# Patient Record
Sex: Female | Born: 1955 | Race: White | Hispanic: No | Marital: Married | Smoking: Never smoker
Health system: Southern US, Community
[De-identification: ages and names within clinical notes are randomized; demographics above are authoritative.]

## PROBLEM LIST (undated history)

## (undated) DIAGNOSIS — C50919 Malignant neoplasm of unspecified site of unspecified female breast: Secondary | ICD-10-CM

## (undated) DIAGNOSIS — D649 Anemia, unspecified: Secondary | ICD-10-CM

## (undated) DIAGNOSIS — T4145XA Adverse effect of unspecified anesthetic, initial encounter: Secondary | ICD-10-CM

## (undated) DIAGNOSIS — R112 Nausea with vomiting, unspecified: Secondary | ICD-10-CM

## (undated) DIAGNOSIS — M1711 Unilateral primary osteoarthritis, right knee: Secondary | ICD-10-CM

## (undated) DIAGNOSIS — F419 Anxiety disorder, unspecified: Secondary | ICD-10-CM

## (undated) DIAGNOSIS — T8859XA Other complications of anesthesia, initial encounter: Secondary | ICD-10-CM

## (undated) DIAGNOSIS — F32A Depression, unspecified: Secondary | ICD-10-CM

## (undated) DIAGNOSIS — Z9889 Other specified postprocedural states: Secondary | ICD-10-CM

## (undated) DIAGNOSIS — R51 Headache: Secondary | ICD-10-CM

## (undated) DIAGNOSIS — R519 Headache, unspecified: Secondary | ICD-10-CM

## (undated) DIAGNOSIS — Z87442 Personal history of urinary calculi: Secondary | ICD-10-CM

## (undated) DIAGNOSIS — F329 Major depressive disorder, single episode, unspecified: Secondary | ICD-10-CM

## (undated) DIAGNOSIS — Z9221 Personal history of antineoplastic chemotherapy: Secondary | ICD-10-CM

## (undated) HISTORY — PX: AXILLARY LYMPH NODE DISSECTION: SHX5229

## (undated) HISTORY — PX: DILATION AND CURETTAGE OF UTERUS: SHX78

---
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
## 1991-01-18 HISTORY — PX: ECTOPIC PREGNANCY SURGERY: SHX613

## 1999-01-18 HISTORY — PX: BREAST LUMPECTOMY: SHX2

## 1999-01-18 HISTORY — PX: MASTECTOMY: SHX3

## 2010-01-17 HISTORY — PX: BREAST RECONSTRUCTION: SHX9

## 2012-01-18 HISTORY — PX: KIDNEY STONE SURGERY: SHX686

## 2013-01-17 HISTORY — PX: KIDNEY STONE SURGERY: SHX686

## 2013-01-17 HISTORY — PX: KNEE ARTHROSCOPY W/ MENISCECTOMY: SHX1879

## 2014-01-17 HISTORY — PX: HERNIA REPAIR: SHX51

## 2014-12-17 ENCOUNTER — Encounter: Payer: Self-pay | Admitting: Physician Assistant

## 2014-12-17 DIAGNOSIS — F419 Anxiety disorder, unspecified: Secondary | ICD-10-CM | POA: Diagnosis present

## 2014-12-17 DIAGNOSIS — M1711 Unilateral primary osteoarthritis, right knee: Secondary | ICD-10-CM | POA: Diagnosis present

## 2014-12-17 NOTE — H&P (Signed)
TOTAL KNEE ADMISSION H&P  Patient is being admitted for right total knee arthroplasty.  Subjective:  Chief Complaint:right knee pain.  HPI: Dana Blanchard, 59 y.o. female, has a history of pain and functional disability in the right knee due to arthritis and has failed non-surgical conservative treatments for greater than 12 weeks to includeNSAID's and/or analgesics, corticosteriod injections, viscosupplementation injections, flexibility and strengthening excercises, supervised PT with diminished ADL's post treatment, use of assistive devices, weight reduction as appropriate and activity modification.  Onset of symptoms was gradual, starting 10 years ago with gradually worsening course since that time. The patient noted prior procedures on the knee to include  arthroscopy and menisectomy on the right knee(s).  Patient currently rates pain in the right knee(s) at 10 out of 10 with activity. Patient has night pain, worsening of pain with activity and weight bearing, pain that interferes with activities of daily living, crepitus and joint swelling.  Patient has evidence of subchondral sclerosis, periarticular osteophytes and joint space narrowing by imaging studies. There is no active infection.  Patient Active Problem List   Diagnosis Date Noted  . Primary localized osteoarthritis of right knee   . Anxiety    Past Medical History  Diagnosis Date  . Anxiety   . Primary localized osteoarthritis of right knee   . Breast cancer [facility])     Past Surgical History  Procedure Laterality Date  . Tonsillectomy  1975  . Ectopic pregnancy surgery  1993  . Cesarean section  1993  . Breast lumpectomy Left 2001  . Mastectomy Left 2001  . Breast reconstruction Left 2012  . Kidney stone surgery  2014  . Kidney stone surgery  2015  . Knee arthroscopy w/ meniscectomy Right 2015  . Hernia repair  2016    No prescriptions prior to admission   No Known Allergies  Social History  Substance Use Topics  .  Smoking status: Never Smoker   . Smokeless tobacco: Not on file  . Alcohol Use: No    Family History  Problem Relation Age of Onset  . Diabetes Mother   . Stroke Mother   . Breast cancer Mother   . Stroke Father   . Colon cancer Father      Review of Systems  Constitutional: Negative.   HENT: Negative.   Eyes: Negative.   Respiratory: Negative.   Cardiovascular: Negative.   Gastrointestinal: Negative.   Genitourinary: Negative.   Musculoskeletal: Positive for back pain and joint pain.  Skin: Negative.   Neurological: Negative.   Endo/Heme/Allergies: Negative.   Psychiatric/Behavioral: Negative.     Objective:  Physical Exam  Constitutional: She is oriented to person, place, and time. She appears well-developed and well-nourished.  HENT:  Head: Normocephalic and atraumatic.  Mouth/Throat: Oropharynx is clear and moist.  Eyes: Conjunctivae are normal. Pupils are equal, round, and reactive to light.  Neck: Neck supple.  Cardiovascular: Normal rate, regular rhythm and normal heart sounds.   Respiratory: Effort normal and breath sounds normal.  GI: Soft. Bowel sounds are normal.  Musculoskeletal:  Examination of her right knee reveals pain medial and laterally. Mild varus deformity. There is 1+ crepitation. There is 1+ synovitis.  Her  range of motion  is from -5 -125 degrees. The knee is stable. Examination of he left knee reveals a full range of motion without pain, swelling, weakness or instability.   Neurological: She is alert and oriented to person, place, and time.  Skin: Skin is warm and dry.  Psychiatric: She  has a normal mood and affect. Her behavior is normal.    Vital signs in last 24 hours: Temp:  [97.5 F (36.4 C)] 97.5 F (36.4 C) (11/30 1400) Pulse Rate:  [85] 85 (11/30 1400) BP: (134)/(82) 134/82 mmHg (11/30 1400) SpO2:  [97 %] 97 % (11/30 1400) Weight:  [95.255 kg (210 lb)] 95.255 kg (210 lb) (11/30 1400)  Labs:   Estimated body mass index is  37.21 kg/(m^2) as calculated from the following:   Height as of this encounter: 5\' 3"  (1.6 m).   Weight as of this encounter: 95.255 kg (210 lb).   Imaging Review Plain radiographs demonstrate severe degenerative joint disease of the right knee(s). The overall alignment issignificant varus. The bone quality appears to be good for age and reported activity level.  Assessment/Plan:  End stage arthritis, right knee  Active Problems:   Primary localized osteoarthritis of right knee   Anxiety breast cancer  The patient history, physical examination, clinical judgment of the provider and imaging studies are consistent with end stage degenerative joint disease of the right knee(s) and total knee arthroplasty is deemed medically necessary. The treatment options including medical management, injection therapy arthroscopy and arthroplasty were discussed at length. The risks and benefits of total knee arthroplasty were presented and reviewed. The risks due to aseptic loosening, infection, stiffness, patella tracking problems, thromboembolic complications and other imponderables were discussed. The patient acknowledged the explanation, agreed to proceed with the plan and consent was signed. Patient is being admitted for inpatient treatment for surgery, pain control, PT, OT, prophylactic antibiotics, VTE prophylaxis, progressive ambulation and ADL's and discharge planning. The patient is planning to be discharged home with home health services

## 2014-12-25 ENCOUNTER — Encounter (HOSPITAL_COMMUNITY): Payer: Self-pay

## 2014-12-25 ENCOUNTER — Encounter (HOSPITAL_COMMUNITY)
Admission: RE | Admit: 2014-12-25 | Discharge: 2014-12-25 | Disposition: A | Discharge status: Home / Self Care | Payer: BC Managed Care – PPO | Source: Ambulatory Visit | Attending: Orthopedic Surgery | Admitting: Orthopedic Surgery

## 2014-12-25 DIAGNOSIS — Z0183 Encounter for blood typing: Secondary | ICD-10-CM | POA: Insufficient documentation

## 2014-12-25 DIAGNOSIS — M1711 Unilateral primary osteoarthritis, right knee: Secondary | ICD-10-CM | POA: Diagnosis not present

## 2014-12-25 DIAGNOSIS — Z01812 Encounter for preprocedural laboratory examination: Secondary | ICD-10-CM | POA: Diagnosis present

## 2014-12-25 HISTORY — DX: Anemia, unspecified: D64.9

## 2014-12-25 HISTORY — DX: Adverse effect of unspecified anesthetic, initial encounter: T41.45XA

## 2014-12-25 HISTORY — DX: Major depressive disorder, single episode, unspecified: F32.9

## 2014-12-25 HISTORY — DX: Depression, unspecified: F32.A

## 2014-12-25 HISTORY — DX: Other complications of anesthesia, initial encounter: T88.59XA

## 2014-12-25 HISTORY — DX: Headache: R51

## 2014-12-25 HISTORY — DX: Headache, unspecified: R51.9

## 2014-12-25 HISTORY — DX: Personal history of urinary calculi: Z87.442

## 2014-12-25 HISTORY — DX: Nausea with vomiting, unspecified: R11.2

## 2014-12-25 HISTORY — DX: Other specified postprocedural states: Z98.890

## 2014-12-25 LAB — ABO/RH: ABO/RH(D): O POS

## 2014-12-25 LAB — CBC WITH DIFFERENTIAL/PLATELET
Basophils Absolute: 0 10*3/uL (ref 0.0–0.1)
Basophils Relative: 0 %
EOS PCT: 3 %
Eosinophils Absolute: 0.3 10*3/uL (ref 0.0–0.7)
HCT: 39.5 % (ref 36.0–46.0)
Hemoglobin: 13.2 g/dL (ref 12.0–15.0)
LYMPHS ABS: 3.3 10*3/uL (ref 0.7–4.0)
LYMPHS PCT: 39 %
MCH: 29.6 pg (ref 26.0–34.0)
MCHC: 33.4 g/dL (ref 30.0–36.0)
MCV: 88.6 fL (ref 78.0–100.0)
MONO ABS: 0.6 10*3/uL (ref 0.1–1.0)
MONOS PCT: 7 %
Neutro Abs: 4.2 10*3/uL (ref 1.7–7.7)
Neutrophils Relative %: 51 %
PLATELETS: 420 10*3/uL — AB (ref 150–400)
RBC: 4.46 MIL/uL (ref 3.87–5.11)
RDW: 13.1 % (ref 11.5–15.5)
WBC: 8.4 10*3/uL (ref 4.0–10.5)

## 2014-12-25 LAB — COMPREHENSIVE METABOLIC PANEL
ALT: 18 U/L (ref 14–54)
AST: 16 U/L (ref 15–41)
Albumin: 4.3 g/dL (ref 3.5–5.0)
Alkaline Phosphatase: 50 U/L (ref 38–126)
Anion gap: 8 (ref 5–15)
BUN: 18 mg/dL (ref 6–20)
CHLORIDE: 104 mmol/L (ref 101–111)
CO2: 25 mmol/L (ref 22–32)
CREATININE: 0.72 mg/dL (ref 0.44–1.00)
Calcium: 10.1 mg/dL (ref 8.9–10.3)
Glucose, Bld: 93 mg/dL (ref 65–99)
POTASSIUM: 4.5 mmol/L (ref 3.5–5.1)
Sodium: 137 mmol/L (ref 135–145)
Total Bilirubin: 0.5 mg/dL (ref 0.3–1.2)
Total Protein: 7.8 g/dL (ref 6.5–8.1)

## 2014-12-25 LAB — TYPE AND SCREEN
ABO/RH(D): O POS
Antibody Screen: NEGATIVE

## 2014-12-25 LAB — SURGICAL PCR SCREEN
MRSA, PCR: NEGATIVE
STAPHYLOCOCCUS AUREUS: NEGATIVE

## 2014-12-25 LAB — PROTIME-INR
INR: 1.13 (ref 0.00–1.49)
PROTHROMBIN TIME: 14.7 s (ref 11.6–15.2)

## 2014-12-25 LAB — APTT: aPTT: 31 seconds (ref 24–37)

## 2014-12-25 NOTE — Pre-Procedure Instructions (Signed)
    Dana Blanchard  12/25/2014      [id number] - [place],  - [address] [place] [address] Phone: [phone] Fax: [phone]    Your procedure is scheduled on 01/05/2015  Report to [facility] Admitting at 5:30  A.M.  Call this number if you have problems the morning of surgery:  [phone]   Remember:  Do not eat food or drink liquids after midnight. On Sunday night   Take these medicines the morning of surgery with A SIP OF WATER: Wellbutrin, Celexa               STOP all supplements & anti-inflammatories on 12/30/2014   Do not wear jewelry, make-up or nail polish.   Do not wear lotions, powders, or perfumes.  You may wear deodorant.   Do not shave 48 hours prior to surgery.    Do not bring valuables to the hospital.  [facility] is not responsible for any belongings or valuables.  Contacts, dentures or bridgework may not be worn into surgery.  Leave your suitcase in the car.  After surgery it may be brought to your room.  For patients admitted to the hospital, discharge time will be determined by your treatment team.  Patients discharged the day of surgery will not be allowed to drive home.   Name and phone number of your driver:   With spouse & daughter  Special instructions:  See SHOWER instructions     Please read over the following fact sheets that you were given. Pain Booklet, Coughing and Deep Breathing, Blood Transfusion Information, Total Joint Packet, MRSA Information and Surgical Site Infection Prevention

## 2014-12-26 LAB — URINE CULTURE

## 2015-01-04 MED ORDER — CHLORHEXIDINE GLUCONATE 4 % EX LIQD: 60.0000 mL | Freq: Once | CUTANEOUS | Status: DC

## 2015-01-04 MED ORDER — LACTATED RINGERS IV SOLN: INTRAVENOUS | Status: DC

## 2015-01-04 MED ORDER — CEFAZOLIN SODIUM-DEXTROSE 2-3 GM-% IV SOLR
2.0000 g | INTRAVENOUS | Status: AC
  Administered 2015-01-05: 2 g via INTRAVENOUS
  Filled 2015-01-04: qty 50

## 2015-01-05 ENCOUNTER — Inpatient Hospital Stay (HOSPITAL_COMMUNITY)
Admission: RE | Admit: 2015-01-05 | Discharge: 2015-01-07 | DRG: 470 | Disposition: A | Discharge status: Home Health | Payer: BC Managed Care – PPO | Source: Ambulatory Visit | Attending: Orthopedic Surgery | Admitting: Orthopedic Surgery

## 2015-01-05 ENCOUNTER — Encounter (HOSPITAL_COMMUNITY): Payer: Self-pay | Admitting: *Deleted

## 2015-01-05 ENCOUNTER — Encounter (HOSPITAL_COMMUNITY)
Admission: RE | Disposition: A | Discharge status: Home Health | Payer: Self-pay | Source: Ambulatory Visit | Attending: Orthopedic Surgery

## 2015-01-05 ENCOUNTER — Inpatient Hospital Stay (HOSPITAL_COMMUNITY): Payer: BC Managed Care – PPO | Admitting: Certified Registered"

## 2015-01-05 DIAGNOSIS — Z9889 Other specified postprocedural states: Secondary | ICD-10-CM

## 2015-01-05 DIAGNOSIS — R112 Nausea with vomiting, unspecified: Secondary | ICD-10-CM | POA: Diagnosis not present

## 2015-01-05 DIAGNOSIS — Z853 Personal history of malignant neoplasm of breast: Secondary | ICD-10-CM | POA: Diagnosis not present

## 2015-01-05 DIAGNOSIS — M1711 Unilateral primary osteoarthritis, right knee: Principal | ICD-10-CM | POA: Diagnosis present

## 2015-01-05 DIAGNOSIS — F419 Anxiety disorder, unspecified: Secondary | ICD-10-CM | POA: Diagnosis present

## 2015-01-05 DIAGNOSIS — M1710 Unilateral primary osteoarthritis, unspecified knee: Secondary | ICD-10-CM | POA: Diagnosis present

## 2015-01-05 DIAGNOSIS — M25561 Pain in right knee: Secondary | ICD-10-CM | POA: Diagnosis present

## 2015-01-05 DIAGNOSIS — M179 Osteoarthritis of knee, unspecified: Secondary | ICD-10-CM | POA: Diagnosis present

## 2015-01-05 HISTORY — DX: Unilateral primary osteoarthritis, right knee: M17.11

## 2015-01-05 HISTORY — DX: Other specified postprocedural states: Z98.890

## 2015-01-05 HISTORY — DX: Nausea with vomiting, unspecified: R11.2

## 2015-01-05 HISTORY — DX: Anxiety disorder, unspecified: F41.9

## 2015-01-05 HISTORY — PX: TOTAL KNEE ARTHROPLASTY: SHX125

## 2015-01-05 HISTORY — DX: Malignant neoplasm of unspecified site of unspecified female breast: C50.919

## 2015-01-05 LAB — CBC
HEMATOCRIT: 33.4 % — AB (ref 36.0–46.0)
Hemoglobin: 11.7 g/dL — ABNORMAL LOW (ref 12.0–15.0)
MCH: 30.3 pg (ref 26.0–34.0)
MCHC: 35 g/dL (ref 30.0–36.0)
MCV: 86.5 fL (ref 78.0–100.0)
PLATELETS: 358 10*3/uL (ref 150–400)
RBC: 3.86 MIL/uL — ABNORMAL LOW (ref 3.87–5.11)
RDW: 13 % (ref 11.5–15.5)
WBC: 7.9 10*3/uL (ref 4.0–10.5)

## 2015-01-05 LAB — CREATININE, SERUM
Creatinine, Ser: 0.68 mg/dL (ref 0.44–1.00)
GFR calc non Af Amer: >60 mL/min (ref >60)

## 2015-01-05 SURGERY — ARTHROPLASTY, KNEE, TOTAL
Anesthesia: Spinal | Site: Knee | Laterality: Right

## 2015-01-05 MED ORDER — BUPIVACAINE-EPINEPHRINE (PF) 0.25% -1:200000 IJ SOLN
INTRAMUSCULAR | Status: AC
  Filled 2015-01-05: qty 30

## 2015-01-05 MED ORDER — DEXAMETHASONE SODIUM PHOSPHATE 10 MG/ML IJ SOLN
INTRAMUSCULAR | Status: AC
  Filled 2015-01-05: qty 1

## 2015-01-05 MED ORDER — ONDANSETRON HCL 4 MG/2ML IJ SOLN
INTRAMUSCULAR | Status: AC
  Filled 2015-01-05: qty 4

## 2015-01-05 MED ORDER — ENOXAPARIN SODIUM 30 MG/0.3ML ~~LOC~~ SOLN
30.0000 mg | Freq: Two times a day (BID) | SUBCUTANEOUS | Status: DC
  Administered 2015-01-06 – 2015-01-07 (×3): 30 mg via SUBCUTANEOUS
  Filled 2015-01-05 (×3): qty 0.3

## 2015-01-05 MED ORDER — PHENOL 1.4 % MT LIQD
1.0000 | OROMUCOSAL | Status: DC | PRN
Start: 2015-01-05 — End: 2015-01-07

## 2015-01-05 MED ORDER — MENTHOL 3 MG MT LOZG: 1.0000 | LOZENGE | OROMUCOSAL | Status: DC | PRN

## 2015-01-05 MED ORDER — ONDANSETRON HCL 4 MG/2ML IJ SOLN: 4.0000 mg | Freq: Once | INTRAMUSCULAR | Status: DC | PRN

## 2015-01-05 MED ORDER — DEXAMETHASONE SODIUM PHOSPHATE 10 MG/ML IJ SOLN
10.0000 mg | Freq: Three times a day (TID) | INTRAMUSCULAR | Status: AC
  Administered 2015-01-05 – 2015-01-06 (×3): 10 mg via INTRAVENOUS
  Filled 2015-01-05 (×3): qty 1

## 2015-01-05 MED ORDER — MIDAZOLAM HCL 5 MG/5ML IJ SOLN
INTRAMUSCULAR | Status: DC | PRN
  Administered 2015-01-05: 2 mg via INTRAVENOUS

## 2015-01-05 MED ORDER — PROPOFOL 10 MG/ML IV BOLUS
INTRAVENOUS | Status: AC
  Filled 2015-01-05: qty 20

## 2015-01-05 MED ORDER — ONDANSETRON HCL 4 MG/2ML IJ SOLN
INTRAMUSCULAR | Status: DC | PRN
  Administered 2015-01-05: 4 mg via INTRAVENOUS

## 2015-01-05 MED ORDER — LORATADINE 10 MG PO TABS
10.0000 mg | ORAL_TABLET | Freq: Every day | ORAL | Status: DC
  Administered 2015-01-06 – 2015-01-07 (×2): 10 mg via ORAL
  Filled 2015-01-05 (×2): qty 1

## 2015-01-05 MED ORDER — FENTANYL CITRATE (PF) 250 MCG/5ML IJ SOLN
INTRAMUSCULAR | Status: AC
  Filled 2015-01-05: qty 5

## 2015-01-05 MED ORDER — MEPERIDINE HCL 25 MG/ML IJ SOLN: 6.2500 mg | INTRAMUSCULAR | Status: DC | PRN

## 2015-01-05 MED ORDER — FENOFIBRATE 160 MG PO TABS
160.0000 mg | ORAL_TABLET | Freq: Every day | ORAL | Status: DC
  Administered 2015-01-06 – 2015-01-07 (×2): 160 mg via ORAL
  Filled 2015-01-05 (×2): qty 1

## 2015-01-05 MED ORDER — VITAMIN D3 25 MCG (1000 UNIT) PO TABS
1000.0000 [IU] | ORAL_TABLET | Freq: Every day | ORAL | Status: DC
  Administered 2015-01-06 – 2015-01-07 (×2): 1000 [IU] via ORAL
  Filled 2015-01-05 (×5): qty 1

## 2015-01-05 MED ORDER — LACTATED RINGERS IV SOLN
INTRAVENOUS | Status: DC | PRN
  Administered 2015-01-05 (×2): via INTRAVENOUS

## 2015-01-05 MED ORDER — POLYETHYLENE GLYCOL 3350 17 G PO PACK
17.0000 g | PACK | Freq: Two times a day (BID) | ORAL | Status: DC
  Administered 2015-01-05 – 2015-01-07 (×5): 17 g via ORAL
  Filled 2015-01-05 (×4): qty 1

## 2015-01-05 MED ORDER — MELATONIN 3 MG PO TABS: 1.0000 | ORAL_TABLET | Freq: Every day | ORAL | Status: DC

## 2015-01-05 MED ORDER — BUPIVACAINE IN DEXTROSE 0.75-8.25 % IT SOLN
INTRATHECAL | Status: DC | PRN
  Administered 2015-01-05: 2 mL via INTRATHECAL

## 2015-01-05 MED ORDER — SUMATRIPTAN SUCCINATE 100 MG PO TABS: 100.0000 mg | ORAL_TABLET | ORAL | Status: DC | PRN

## 2015-01-05 MED ORDER — MAGNESIUM 200 MG PO TABS: 200.0000 mg | ORAL_TABLET | Freq: Every day | ORAL | Status: DC

## 2015-01-05 MED ORDER — DIPHENHYDRAMINE HCL 12.5 MG/5ML PO ELIX: 12.5000 mg | ORAL_SOLUTION | ORAL | Status: DC | PRN

## 2015-01-05 MED ORDER — ACETAMINOPHEN 650 MG RE SUPP: 650.0000 mg | Freq: Four times a day (QID) | RECTAL | Status: DC | PRN

## 2015-01-05 MED ORDER — PROPOFOL 500 MG/50ML IV EMUL
INTRAVENOUS | Status: DC | PRN
  Administered 2015-01-05: 10 ug/kg/min via INTRAVENOUS

## 2015-01-05 MED ORDER — CITALOPRAM HYDROBROMIDE 10 MG PO TABS
10.0000 mg | ORAL_TABLET | Freq: Every day | ORAL | Status: DC
  Administered 2015-01-06 – 2015-01-07 (×2): 10 mg via ORAL
  Filled 2015-01-05 (×2): qty 1

## 2015-01-05 MED ORDER — METOCLOPRAMIDE HCL 5 MG/ML IJ SOLN
5.0000 mg | Freq: Three times a day (TID) | INTRAMUSCULAR | Status: DC | PRN
  Administered 2015-01-05: 10 mg via INTRAVENOUS
  Filled 2015-01-05 (×2): qty 2

## 2015-01-05 MED ORDER — MAGNESIUM OXIDE 400 (241.3 MG) MG PO TABS
200.0000 mg | ORAL_TABLET | Freq: Every day | ORAL | Status: DC
  Administered 2015-01-06 – 2015-01-07 (×2): 200 mg via ORAL
  Filled 2015-01-05 (×2): qty 1

## 2015-01-05 MED ORDER — FENTANYL CITRATE (PF) 100 MCG/2ML IJ SOLN
INTRAMUSCULAR | Status: DC | PRN
  Administered 2015-01-05: 100 ug via INTRAVENOUS
  Administered 2015-01-05: 50 ug via INTRAVENOUS

## 2015-01-05 MED ORDER — BUPROPION HCL ER (SR) 150 MG PO TB12
150.0000 mg | ORAL_TABLET | Freq: Every day | ORAL | Status: DC
  Administered 2015-01-06 – 2015-01-07 (×2): 150 mg via ORAL
  Filled 2015-01-05 (×2): qty 1

## 2015-01-05 MED ORDER — DEXAMETHASONE SODIUM PHOSPHATE 10 MG/ML IJ SOLN
INTRAMUSCULAR | Status: DC | PRN
  Administered 2015-01-05: 10 mg via INTRAVENOUS

## 2015-01-05 MED ORDER — POVIDONE-IODINE 7.5 % EX SOLN
Freq: Once | CUTANEOUS | Status: DC
  Filled 2015-01-05: qty 118

## 2015-01-05 MED ORDER — SODIUM CHLORIDE 0.9 % IR SOLN
Status: DC | PRN
  Administered 2015-01-05: 3000 mL

## 2015-01-05 MED ORDER — METOCLOPRAMIDE HCL 5 MG PO TABS: 5.0000 mg | ORAL_TABLET | Freq: Three times a day (TID) | ORAL | Status: DC | PRN

## 2015-01-05 MED ORDER — DOCUSATE SODIUM 100 MG PO CAPS
100.0000 mg | ORAL_CAPSULE | Freq: Two times a day (BID) | ORAL | Status: DC
  Administered 2015-01-05 – 2015-01-07 (×5): 100 mg via ORAL
  Filled 2015-01-05 (×5): qty 1

## 2015-01-05 MED ORDER — ONDANSETRON HCL 4 MG PO TABS
4.0000 mg | ORAL_TABLET | Freq: Four times a day (QID) | ORAL | Status: DC | PRN
  Administered 2015-01-05 – 2015-01-06 (×2): 4 mg via ORAL
  Filled 2015-01-05 (×2): qty 1

## 2015-01-05 MED ORDER — PROPOFOL 10 MG/ML IV BOLUS
INTRAVENOUS | Status: DC | PRN
  Administered 2015-01-05: 30 mg via INTRAVENOUS
  Administered 2015-01-05: 20 mg via INTRAVENOUS
  Administered 2015-01-05: 40 mg via INTRAVENOUS
  Administered 2015-01-05: 20 mg via INTRAVENOUS

## 2015-01-05 MED ORDER — POTASSIUM CHLORIDE IN NACL 20-0.9 MEQ/L-% IV SOLN
INTRAVENOUS | Status: DC
  Administered 2015-01-05 (×2): via INTRAVENOUS
  Filled 2015-01-05 (×2): qty 1000

## 2015-01-05 MED ORDER — HYDROMORPHONE HCL 1 MG/ML IJ SOLN
1.0000 mg | INTRAMUSCULAR | Status: DC | PRN
  Administered 2015-01-05 (×2): 1 mg via INTRAVENOUS
  Filled 2015-01-05 (×3): qty 1

## 2015-01-05 MED ORDER — OXYCODONE HCL 5 MG PO TABS
5.0000 mg | ORAL_TABLET | ORAL | Status: DC | PRN
  Administered 2015-01-05 – 2015-01-07 (×9): 10 mg via ORAL
  Filled 2015-01-05 (×9): qty 2

## 2015-01-05 MED ORDER — ONDANSETRON HCL 4 MG/2ML IJ SOLN
4.0000 mg | Freq: Four times a day (QID) | INTRAMUSCULAR | Status: DC | PRN
  Administered 2015-01-05 – 2015-01-06 (×2): 4 mg via INTRAVENOUS
  Filled 2015-01-05 (×2): qty 2

## 2015-01-05 MED ORDER — CEFAZOLIN SODIUM-DEXTROSE 2-3 GM-% IV SOLR
2.0000 g | Freq: Four times a day (QID) | INTRAVENOUS | Status: AC
  Administered 2015-01-05 (×2): 2 g via INTRAVENOUS
  Filled 2015-01-05 (×2): qty 50

## 2015-01-05 MED ORDER — MIDAZOLAM HCL 2 MG/2ML IJ SOLN
INTRAMUSCULAR | Status: AC
  Filled 2015-01-05: qty 2

## 2015-01-05 MED ORDER — 0.9 % SODIUM CHLORIDE (POUR BTL) OPTIME
TOPICAL | Status: DC | PRN
  Administered 2015-01-05: 1000 mL

## 2015-01-05 MED ORDER — HYDROMORPHONE HCL 1 MG/ML IJ SOLN: 0.2500 mg | INTRAMUSCULAR | Status: DC | PRN

## 2015-01-05 MED ORDER — ACETAMINOPHEN 325 MG PO TABS
650.0000 mg | ORAL_TABLET | Freq: Four times a day (QID) | ORAL | Status: DC | PRN
  Administered 2015-01-05: 650 mg via ORAL
  Filled 2015-01-05: qty 2

## 2015-01-05 MED ORDER — BUPIVACAINE-EPINEPHRINE 0.25% -1:200000 IJ SOLN
INTRAMUSCULAR | Status: DC | PRN
  Administered 2015-01-05: 30 mL

## 2015-01-05 MED ORDER — BUPIVACAINE-EPINEPHRINE (PF) 0.5% -1:200000 IJ SOLN
INTRAMUSCULAR | Status: DC | PRN
  Administered 2015-01-05: 30 mL via PERINEURAL

## 2015-01-05 MED ORDER — CELECOXIB 200 MG PO CAPS
200.0000 mg | ORAL_CAPSULE | Freq: Two times a day (BID) | ORAL | Status: DC
  Administered 2015-01-05 – 2015-01-07 (×5): 200 mg via ORAL
  Filled 2015-01-05 (×5): qty 1

## 2015-01-05 MED ORDER — ALUM & MAG HYDROXIDE-SIMETH 200-200-20 MG/5ML PO SUSP: 30.0000 mL | ORAL | Status: DC | PRN

## 2015-01-05 MED ORDER — OXYCODONE HCL ER 10 MG PO T12A
20.0000 mg | EXTENDED_RELEASE_TABLET | Freq: Two times a day (BID) | ORAL | Status: DC
  Administered 2015-01-05 – 2015-01-07 (×5): 20 mg via ORAL
  Filled 2015-01-05 (×5): qty 2

## 2015-01-05 SURGICAL SUPPLY — 71 items
BANDAGE ESMARK 6X9 LF (GAUZE/BANDAGES/DRESSINGS) ×1 IMPLANT
BENZOIN TINCTURE PRP APPL 2/3 (GAUZE/BANDAGES/DRESSINGS) ×3 IMPLANT
BLADE SAGITTAL 25.0X1.19X90 (BLADE) ×2 IMPLANT
BLADE SAGITTAL 25.0X1.19X90MM (BLADE) ×1
BLADE SAW RECIP 87.9 MT (BLADE) ×3 IMPLANT
BLADE SAW SAG 90X13X1.27 (BLADE) ×3 IMPLANT
BLADE SURG 10 STRL SS (BLADE) ×6 IMPLANT
BNDG ELASTIC 6X15 VLCR STRL LF (GAUZE/BANDAGES/DRESSINGS) ×3 IMPLANT
BNDG ESMARK 6X9 LF (GAUZE/BANDAGES/DRESSINGS) ×3
BOWL SMART MIX CTS (DISPOSABLE) ×3 IMPLANT
CAPT KNEE TOTAL 3 ATTUNE ×3 IMPLANT
CEMENT HV SMART SET (Cement) ×6 IMPLANT
CLOSURE WOUND 1/2 X4 (GAUZE/BANDAGES/DRESSINGS) ×1
COVER SURGICAL LIGHT HANDLE (MISCELLANEOUS) ×3 IMPLANT
CUFF TOURNIQUET SINGLE 34IN LL (TOURNIQUET CUFF) ×3 IMPLANT
CUFF TOURNIQUET SINGLE 44IN (TOURNIQUET CUFF) IMPLANT
DECANTER SPIKE VIAL GLASS SM (MISCELLANEOUS) IMPLANT
DRAPE EXTREMITY T 121X128X90 (DRAPE) ×3 IMPLANT
DRAPE INCISE IOBAN 66X45 STRL (DRAPES) ×3 IMPLANT
DRAPE PROXIMA HALF (DRAPES) ×3 IMPLANT
DRAPE U-SHAPE 47X51 STRL (DRAPES) ×3 IMPLANT
DRSG AQUACEL AG ADV 3.5X14 (GAUZE/BANDAGES/DRESSINGS) ×3 IMPLANT
DRSG PAD ABDOMINAL 8X10 ST (GAUZE/BANDAGES/DRESSINGS) IMPLANT
DURAPREP 26ML APPLICATOR (WOUND CARE) ×3 IMPLANT
ELECT CAUTERY BLADE 6.4 (BLADE) ×3 IMPLANT
ELECT REM PT RETURN 9FT ADLT (ELECTROSURGICAL) ×3
ELECTRODE REM PT RTRN 9FT ADLT (ELECTROSURGICAL) ×1 IMPLANT
EVACUATOR 1/8 PVC DRAIN (DRAIN) IMPLANT
FACESHIELD WRAPAROUND (MASK) ×6 IMPLANT
GAUZE SPONGE 4X4 12PLY STRL (GAUZE/BANDAGES/DRESSINGS) IMPLANT
GLOVE BIO SURGEON STRL SZ7 (GLOVE) ×3 IMPLANT
GLOVE BIOGEL PI IND STRL 7.0 (GLOVE) ×1 IMPLANT
GLOVE BIOGEL PI IND STRL 7.5 (GLOVE) ×1 IMPLANT
GLOVE BIOGEL PI INDICATOR 7.0 (GLOVE) ×2
GLOVE BIOGEL PI INDICATOR 7.5 (GLOVE) ×2
GLOVE SS BIOGEL STRL SZ 7.5 (GLOVE) ×1 IMPLANT
GLOVE SUPERSENSE BIOGEL SZ 7.5 (GLOVE) ×2
GOWN STRL REUS W/ TWL LRG LVL3 (GOWN DISPOSABLE) ×1 IMPLANT
GOWN STRL REUS W/ TWL XL LVL3 (GOWN DISPOSABLE) ×2 IMPLANT
GOWN STRL REUS W/TWL LRG LVL3 (GOWN DISPOSABLE) ×2
GOWN STRL REUS W/TWL XL LVL3 (GOWN DISPOSABLE) ×4
HANDPIECE INTERPULSE COAX TIP (DISPOSABLE) ×2
HOOD PEEL AWAY FACE SHEILD DIS (HOOD) ×6 IMPLANT
IMMOBILIZER KNEE 22 UNIV (SOFTGOODS) ×3 IMPLANT
KIT BASIN OR (CUSTOM PROCEDURE TRAY) ×3 IMPLANT
KIT ROOM TURNOVER OR (KITS) ×3 IMPLANT
MANIFOLD NEPTUNE II (INSTRUMENTS) ×3 IMPLANT
MARKER SKIN DUAL TIP RULER LAB (MISCELLANEOUS) ×3 IMPLANT
NS IRRIG 1000ML POUR BTL (IV SOLUTION) ×3 IMPLANT
PACK TOTAL JOINT (CUSTOM PROCEDURE TRAY) ×3 IMPLANT
PACK UNIVERSAL I (CUSTOM PROCEDURE TRAY) ×3 IMPLANT
PAD ARMBOARD 7.5X6 YLW CONV (MISCELLANEOUS) ×6 IMPLANT
PADDING CAST COTTON 6X4 STRL (CAST SUPPLIES) IMPLANT
RUBBERBAND STERILE (MISCELLANEOUS) IMPLANT
SET HNDPC FAN SPRY TIP SCT (DISPOSABLE) ×1 IMPLANT
STRIP CLOSURE SKIN 1/2X4 (GAUZE/BANDAGES/DRESSINGS) ×2 IMPLANT
SUCTION FRAZIER TIP 10 FR DISP (SUCTIONS) ×3 IMPLANT
SUT MNCRL AB 3-0 PS2 18 (SUTURE) ×3 IMPLANT
SUT VIC AB 0 CT1 27 (SUTURE) ×4
SUT VIC AB 0 CT1 27XBRD ANBCTR (SUTURE) ×2 IMPLANT
SUT VIC AB 1 CT1 27 (SUTURE) ×2
SUT VIC AB 1 CT1 27XBRD ANBCTR (SUTURE) ×1 IMPLANT
SUT VIC AB 2-0 CT1 27 (SUTURE) ×4
SUT VIC AB 2-0 CT1 TAPERPNT 27 (SUTURE) ×2 IMPLANT
SYR 30ML SLIP (SYRINGE) ×3 IMPLANT
TOWEL OR 17X24 6PK STRL BLUE (TOWEL DISPOSABLE) ×3 IMPLANT
TOWEL OR 17X26 10 PK STRL BLUE (TOWEL DISPOSABLE) ×3 IMPLANT
TRAY FOLEY CATH 16FR SILVER (SET/KITS/TRAYS/PACK) ×3 IMPLANT
TUBE CONNECTING 12'X1/4 (SUCTIONS) ×1
TUBE CONNECTING 12X1/4 (SUCTIONS) ×2 IMPLANT
YANKAUER SUCT BULB TIP NO VENT (SUCTIONS) ×3 IMPLANT

## 2015-01-05 NOTE — Evaluation (Signed)
Occupational Therapy Evaluation Patient Details Name: Dana Blanchard MRN: [id number] DOB: June 30, 1955 Today's Date: 01/05/2015    History of Present Illness Pt admitted for R TKA. PMHx: anxiety, breast CA, mastectomy and reconstruction   Clinical Impression   Pt reports she was independent with ADLs and mobility PTA. Currently pt is overall min guard for ADLs and functional mobility with the exception of min assist for LB ADLs. Began ADL and safety education with pt; she verbalized understanding. Pt planning to d/c home with 24/7 supervision from family. Pt would benefit from skilled OT in order to maximize independence and safety with tub transfers using a 3 in 1.    Follow Up Recommendations  No OT follow up;Supervision - Intermittent    Equipment Recommendations  3 in 1 bedside comode    Recommendations for Other Services       Precautions / Restrictions Precautions Precautions: Knee;Fall Precaution Comments: Reviewed precautions, no pillow under knee, and use of zero degree bone foam Required Braces or Orthoses: Knee Immobilizer - Right Restrictions Weight Bearing Restrictions: Yes RLE Weight Bearing: Weight bearing as tolerated      Mobility Bed Mobility Overal bed mobility: Modified Independent             General bed mobility comments: Pt OOB in chair   Transfers Overall transfer level: Needs assistance Equipment used: Rolling walker (2 wheeled) Transfers: Sit to/from Stand Sit to Stand: Min guard         General transfer comment: Good hand placement and technique. Applied KI in sitting before transfers attempted    Balance Overall balance assessment: Needs assistance Sitting-balance support: Feet supported Sitting balance-Leahy Scale: Good     Standing balance support: Bilateral upper extremity supported Standing balance-Leahy Scale: Poor Standing balance comment: RW for support                            ADL Overall ADL's : Needs  assistance/impaired Eating/Feeding: Set up;Sitting   Grooming: Min guard;Standing       Lower Body Bathing: Minimal assistance;Sit to/from stand       Lower Body Dressing: Minimal assistance;Sit to/from stand Lower Body Dressing Details (indicate cue type and reason): Pt with difficulty reaching L foot; pt reports husband can help with ADLs as needed. Educated on compensatory strategies for LB ADLs; pt verbalized understanding. Toilet Transfer: Min guard;Ambulation;Regular Toilet;RW (BSC over toilet) Toilet Transfer Details (indicate cue type and reason): Educated on use of 3 in 1 over toilet       Tub/Shower Transfer Details (indicate cue type and reason): Educated on use of 3 in 1 in tub as a shower seat; plan to practice next session Functional mobility during ADLs: Min guard;Rolling walker General ADL Comments: No family present for OT eval. Educated on home safety, need for supervision during ADLs and mobility, ice for edema and pain; pt verbalized understanding.     Vision     Perception     Praxis      Pertinent Vitals/Pain Pain Assessment: 0-10 Pain Score: 2  Pain Location: R knee Pain Descriptors / Indicators: Sore Pain Intervention(s): Limited activity within patient's tolerance;Monitored during session;Repositioned;Ice applied     Hand Dominance     Extremity/Trunk Assessment Upper Extremity Assessment Upper Extremity Assessment: Overall WFL for tasks assessed   Lower Extremity Assessment Lower Extremity Assessment: Defer to PT evaluation RLE Deficits / Details: limited ROm and strength as expected post op   Cervical / Trunk  Assessment Cervical / Trunk Assessment: Normal   Communication Communication Communication: No difficulties   Cognition Arousal/Alertness: Awake/alert Behavior During Therapy: WFL for tasks assessed/performed Overall Cognitive Status: Within Functional Limits for tasks assessed                     General Comments        Exercises       Shoulder Instructions      Home Living Family/patient expects to be discharged to:: Private residence Living Arrangements: Spouse/significant other Available Help at Discharge: Family;Available 24 hours/day Type of Home: House Home Access: Stairs to enter [employer] of Steps: 2 Entrance Stairs-Rails: None Home Layout: Two level;Able to live on main level with bedroom/bathroom     Bathroom Shower/Tub: Teacher, early years/pre: Handicapped height Bathroom Accessibility: Yes How Accessible: Accessible via walker Home Equipment: None          Prior Functioning/Environment Level of Independence: Independent             OT Diagnosis: Acute pain   OT Problem List: Impaired balance (sitting and/or standing);Decreased knowledge of use of DME or AE;Decreased knowledge of precautions;Pain   OT Treatment/Interventions: Self-care/ADL training;DME and/or AE instruction;Patient/family education    OT Goals(Current goals can be found in the care plan section) Acute Rehab OT Goals Patient Stated Goal: return to independence OT Goal Formulation: With patient Time For Goal Achievement: 01/19/15 Potential to Achieve Goals: Good ADL Goals Pt Will Perform Tub/Shower Transfer: Tub transfer;with supervision;ambulating;3 in 1;rolling walker  OT Frequency: Min 2X/week   Barriers to D/C:            Co-evaluation              End of Session Equipment Utilized During Treatment: Gait belt;Rolling walker;Right knee immobilizer CPM Right Knee CPM Right Knee: Off Right Knee Flexion (Degrees): 90 Right Knee Extension (Degrees): 0 Nurse Communication: Other (comment) (check pts IV)  Activity Tolerance: Patient tolerated treatment well Patient left: in chair;with call bell/phone within reach   Time: [id number] OT Time Calculation (min): 17 min Charges:  OT General Charges $OT Visit: 1 Procedure OT Evaluation $Initial OT Evaluation  Tier I: 1 Procedure G-Codes:     Binnie Kand M.S., OTR/L Pager: [phone]  01/05/2015, 3:10 PM

## 2015-01-05 NOTE — Transfer of Care (Signed)
Immediate Anesthesia Transfer of Care Note  Patient: Dana Blanchard  Procedure(s) Performed: Procedure(s): RIGHT TOTAL KNEE ARTHROPLASTY (Right)  Patient Location: PACU  Anesthesia Type:Spinal and MAC combined with regional for post-op pain  Level of Consciousness: awake, alert , oriented, patient cooperative and responds to stimulation  Airway & Oxygen Therapy: Patient Spontanous Breathing and Patient connected to nasal cannula oxygen  Post-op Assessment: Report given to RN and Post -op Vital signs reviewed and stable  Post vital signs: Reviewed and stable  Last Vitals:  Filed Vitals:   01/05/15 0622 01/05/15 0925  BP: 125/65   Pulse: 89   Temp: 36.5 C 36.5 C  Resp: 20     Complications: No apparent anesthesia complications

## 2015-01-05 NOTE — Anesthesia Postprocedure Evaluation (Signed)
Anesthesia Post Note  Patient: Dana Blanchard  Procedure(s) Performed: Procedure(s) (LRB): RIGHT TOTAL KNEE ARTHROPLASTY (Right)  Patient location during evaluation: PACU Anesthesia Type: Spinal and MAC Level of consciousness: awake and alert Pain management: pain level controlled Vital Signs Assessment: post-procedure vital signs reviewed and stable Respiratory status: spontaneous breathing and respiratory function stable Cardiovascular status: blood pressure returned to baseline and stable Postop Assessment: spinal receding Anesthetic complications: no    Last Vitals:  Filed Vitals:   01/05/15 0945 01/05/15 0953  BP:  114/73  Pulse: 98 93  Temp:  36.5 C  Resp: 14 16    Last Pain:  Filed Vitals:   01/05/15 1024  PainSc: 0-No pain                 Jaeveon Ashland DAVID

## 2015-01-05 NOTE — Evaluation (Signed)
Physical Therapy Evaluation Patient Details Name: Dana Blanchard MRN: [id number] DOB: [birth year] Today's Date: 01/05/2015   History of Present Illness  Pt admitted for R TKA. PMHx: anxiety, breast CA, mastectomy and reconstruction  Clinical Impression  Pt tolerated therapy well today. She c/o mild nausea upon sitting EOB but this dissipated quickly. Pt presents with decreased R LE strength/ROM, balance, and functional mobility. Education was provided on HEP, use of bone foam/CPM, transferring with staff, and to avoid use of pillow under knee. Pt will benefit from acute PT to improve safety with functional mobility and increase R LE strength, ROM, and balance. When medically cleared, D/C to home with HHPT would be most appropriate, pt verbally agreed to [facility].     Follow Up Recommendations Home health PT    Equipment Recommendations  Rolling walker with 5" wheels;3in1 (PT)    Recommendations for Other Services       Precautions / Restrictions Precautions Precautions: Knee Restrictions Weight Bearing Restrictions: Yes RLE Weight Bearing: Weight bearing as tolerated      Mobility  Bed Mobility Overal bed mobility: Modified Independent             General bed mobility comments: increased time with HOB 25degrees and use of rail   Transfers Overall transfer level: Needs assistance   Transfers: Sit to/from Stand Sit to Stand: Supervision         General transfer comment: cues for hand placement, sequence and safety  Ambulation/Gait Ambulation/Gait assistance: Min guard Ambulation Distance (Feet): 70 Feet Assistive device: Rolling walker (2 wheeled) Gait Pattern/deviations: Step-to pattern;Decreased stride length   Gait velocity interpretation: Below normal speed for age/gender General Gait Details: cues for posture, looking up, gait sequence and safety  Stairs            Wheelchair Mobility    Modified Rankin (Stroke Patients Only)       Balance  Overall balance assessment: Needs assistance Sitting-balance support: Feet unsupported;No upper extremity supported Sitting balance-Leahy Scale: Good     Standing balance support: Bilateral upper extremity supported Standing balance-Leahy Scale: Poor                               Pertinent Vitals/Pain Pain Assessment: No/denies pain    Home Living Family/patient expects to be discharged to:: Private residence Living Arrangements: Spouse/significant other Available Help at Discharge: Family Type of Home: House       Home Layout: Two level;Able to live on main level with bedroom/bathroom Home Equipment: None      Prior Function Level of Independence: Independent               Hand Dominance        Extremity/Trunk Assessment   Upper Extremity Assessment: Overall WFL for tasks assessed           Lower Extremity Assessment: RLE deficits/detail RLE Deficits / Details: limited ROm and strength as expected post op    Cervical / Trunk Assessment: Normal  Communication   Communication: No difficulties  Cognition Arousal/Alertness: Awake/alert Behavior During Therapy: WFL for tasks assessed/performed Overall Cognitive Status: Within Functional Limits for tasks assessed                      General Comments      Exercises Total Joint Exercises Ankle Circles/Pumps: AROM;Seated;Both;20 reps Quad Sets: AAROM;Right;10 reps;Supine Heel Slides: AAROM;Right;10 reps;Supine Hip ABduction/ADduction: AROM;Seated;15 reps;Both Straight Leg Raises: AAROM;5 reps;Right;Supine  Assessment/Plan    PT Assessment Patient needs continued PT services  PT Diagnosis Difficulty walking;Acute pain   PT Problem List Decreased strength;Decreased range of motion;Decreased activity tolerance;Decreased knowledge of use of DME;Decreased mobility  PT Treatment Interventions DME instruction;Gait training;Stair training;Functional mobility training;Therapeutic  activities;Therapeutic exercise;Patient/family education   PT Goals (Current goals can be found in the Care Plan section) Acute Rehab PT Goals Patient Stated Goal: [place] PT Goal Formulation: With patient/family Time For Goal Achievement: 01/12/15 Potential to Achieve Goals: Good    Frequency 7X/week   Barriers to discharge        Co-evaluation               End of Session Equipment Utilized During Treatment: Gait belt;Right knee immobilizer   Patient left: in chair;with call bell/phone within reach;with family/visitor present Nurse Communication: Mobility status;Precautions;Weight bearing status         Time: 0110-0144 PT Time Calculation (min) (ACUTE ONLY): 34 min   Charges:   PT Evaluation $Initial PT Evaluation Tier I: 1 Procedure PT Treatments $Gait Training: 8-22 mins   PT G CodesHaynes Bast 01/21/15, 2:55 PM Haynes Bast, SPT 2015/01/21 2:55 PM

## 2015-01-05 NOTE — Anesthesia Procedure Notes (Addendum)
Spinal Patient location during procedure: OR Start time: 01/05/2015 7:15 AM End time: 01/05/2015 7:20 AM Staffing Anesthesiologist: Lillia Abed Performed by: anesthesiologist  Preanesthetic Checklist Completed: patient identified, site marked, surgical consent, pre-op evaluation, timeout performed, IV checked, risks and benefits discussed and monitors and equipment checked Spinal Block Patient position: sitting Prep: Betadine Patient monitoring: heart rate, cardiac monitor, continuous pulse ox and blood pressure Approach: right paramedian Location: L3-4 Injection technique: single-shot Needle Needle type: Pencan  Needle gauge: 24 G Needle length: 9 cm Needle insertion depth: 5 cm  Procedure Name: MAC Date/Time: 01/05/2015 7:20 AM Performed by: Jacquiline Doe A Pre-anesthesia Checklist: Patient identified, Emergency Drugs available, Suction available, Patient being monitored and Timeout performed Patient Re-evaluated:Patient Re-evaluated prior to inductionOxygen Delivery Method: Nasal cannula Intubation Type: IV induction Placement Confirmation: positive ETCO2 Dental Injury: Teeth and Oropharynx as per pre-operative assessment     Anesthesia Regional Block:  Adductor canal block  Pre-Anesthetic Checklist: ,, timeout performed, Correct Patient, Correct Site, Correct Laterality, Correct Procedure, Correct Position, site marked, Risks and benefits discussed,  Surgical consent,  Pre-op evaluation,  At surgeon's request and post-op pain management  Laterality: Right  Prep: chloraprep       Needles:  Injection technique: Single-shot  Needle Type: Echogenic Stimulator Needle     Needle Length: 9cm 9 cm Needle Gauge: 21 and 21 G    Additional Needles:  Procedures: ultrasound guided (picture in chart) Adductor canal block Narrative:  Start time: 01/05/2015 7:00 AM End time: 01/05/2015 7:10 AM Injection made incrementally with aspirations every 5 mL.  Performed by:  Personally  Anesthesiologist: Lillia Abed  Additional Notes: Monitors applied. Patient sedated. Sterile prep and drape,hand hygiene and sterile gloves were used. Relevant anatomy identified.Needle position confirmed.Local anesthetic injected incrementally after negative aspiration. Local anesthetic spread visualized around nerve(s). Vascular puncture avoided. No complications. Image printed for medical record.The patient tolerated the procedure well.    Lillia Abed MD

## 2015-01-05 NOTE — Progress Notes (Signed)
Utilization review completed.  

## 2015-01-05 NOTE — Interval H&P Note (Signed)
History and Physical Interval Note:  01/05/2015 6:50 AM  Dana Blanchard  has presented today for surgery, with the diagnosis of PRIMARY LOCALIZED OA RIGHT KNEE  The various methods of treatment have been discussed with the patient and family. After consideration of risks, benefits and other options for treatment, the patient has consented to  Procedure(s): RIGHT TOTAL KNEE ARTHROPLASTY (Right) as a surgical intervention .  The patient's history has been reviewed, patient examined, no change in status, stable for surgery.  I have reviewed the patient's chart and labs.  Questions were answered to the patient's satisfaction.     Elsie Saas A

## 2015-01-05 NOTE — Addendum Note (Signed)
Addendum  created 01/05/15 1302 by Fidela Juneau, CRNA   Modules edited: Anesthesia Flowsheet

## 2015-01-05 NOTE — Op Note (Signed)
MRN:     [id number] DOB/AGE:    1955-10-10 / 59 y.o.       OPERATIVE REPORT    DATE OF PROCEDURE:  01/05/2015       PREOPERATIVE DIAGNOSIS:   PRIMARY LOCALIZED OA RIGHT KNEE      Estimated body mass index is 37.79 kg/(m^2) as calculated from the following:   Height as of this encounter: 5\' 3"  (1.6 m).   Weight as of this encounter: 96.73 kg (213 lb 4 oz).                                                        POSTOPERATIVE DIAGNOSIS:   SAME                                                                      PROCEDURE:  Procedure(s): RIGHT TOTAL KNEE ARTHROPLASTY Using Depuy Attune RP implants #6 narrow Femur, #5Tibia, 43mm attune RP bearing, 29 Patella     SURGEON: Jeanne Diefendorf A    ASSISTANT:  Kirstin Shepperson PA-C   (Present and scrubbed throughout the case, critical for assistance with exposure, retraction, instrumentation, and closure.)         ANESTHESIA: Spinal with Femoral Nerve Block     TOURNIQUET TIME: [id number]   COMPLICATIONS:  None     SPECIMENS: None   INDICATIONS FOR PROCEDURE: The patient has  DJD RIGHT KNEE, varus deformities, XR shows bone on bone arthritis. Patient has failed all conservative measures including anti-inflammatory medicines, narcotics, attempts at  exercise and weight loss, cortisone injections and viscosupplementation.  Risks and benefits of surgery have been discussed, questions answered.   DESCRIPTION OF PROCEDURE: The patient identified by armband, received  right femoral nerve block and IV antibiotics, in the holding area at [facility]. Patient taken to the operating room, appropriate anesthetic  monitors were attached General endotracheal anesthesia induced with  the patient in supine position, Foley catheter was inserted. Tourniquet  applied high to the operative thigh. Lateral post and foot positioner  applied to the table, the lower extremity was then prepped and draped  in usual sterile fashion from the ankle to the tourniquet.  Time-out procedure was performed. The limb was wrapped with an Esmarch bandage and the tourniquet inflated to 365 mmHg. We began the operation by making the anterior midline incision starting at handbreadth above the patella going over the patella 1 cm medial to and  4 cm distal to the tibial tubercle. Small bleeders in the skin and the  subcutaneous tissue identified and cauterized. Transverse retinaculum was incised and reflected medially and a medial parapatellar arthrotomy was accomplished. the patella was everted and theprepatellar fat pad resected. The superficial medial collateral  ligament was then elevated from anterior to posterior along the proximal  flare of the tibia and anterior half of the menisci resected. The knee was hyperflexed exposing bone on bone arthritis. Peripheral and notch osteophytes as well as the cruciate ligaments were then resected. We continued to  work our way around posteriorly along the proximal tibia, and externally  rotated  the tibia subluxing it out from underneath the femur. A McHale  retractor was placed through the notch and a lateral Hohmann retractor  placed, and we then drilled through the proximal tibia in line with the  axis of the tibia followed by an intramedullary guide rod and 2-degree  posterior slope cutting guide. The tibial cutting guide was pinned into place  allowing resection of 4 mm of bone medially and about 6 mm of bone  laterally because of her varus deformity. Satisfied with the tibial resection, we then  entered the distal femur 2 mm anterior to the PCL origin with the  intramedullary guide rod and applied the distal femoral cutting guide  set at 21mm, with 5 degrees of valgus. This was pinned along the  epicondylar axis. At this point, the distal femoral cut was accomplished without difficulty. We then sized for a #6 narrow femoral component and pinned the guide in 3 degrees of external rotation.The chamfer cutting guide was pinned into  place. The anterior, posterior, and chamfer cuts were accomplished without difficulty followed by  the Attune RP box cutting guide and the box cut. We also removed posterior osteophytes from the posterior femoral condyles. At this  time, the knee was brought into full extension. We checked our  extension and flexion gaps and found them symmetric at 38mm.  The patella thickness measured at 23 mm. We set the cutting guide at 15 and removed the posterior 8 mm  of the patella sized for 29 button and drilled the lollipop. The knee  was then once again hyperflexed exposing the proximal tibia. We sized for a #5 tibial base plate, applied the smokestack and the conical reamer followed by the the Delta fin keel punch. We then hammered into place the Attune RP trial femoral component, inserted a 5-mm trial bearing, trial patellar button, and took the knee through range of motion from 0-130 degrees. No thumb pressure was required for patellar  tracking. At this point, all trial components were removed, a double batch of DePuy HV cement  was mixed and applied to all bony metallic mating surfaces except for the posterior condyles of the femur itself. In order, we  hammered into place the tibial tray and removed excess cement, the femoral component and removed excess cement, a 5-mm Attune RP bearing  was inserted, and the knee brought to full extension with compression.  The patellar button was clamped into place, and excess cement  removed. While the cement cured the wound was irrigated out with normal saline solution pulse lavage.. Ligament stability and patellar tracking were checked and found to be excellent. The tourniquet was then released and hemostasis was obtained with cautery. The parapatellar arthrotomy was closed with  #1 ethibond suture. The subcutaneous tissue with 0 and 2-0 undyed  Vicryl suture, and 4-0 Monocryl.. A dressing of Xeroform,  4 x 4, dressing sponges, Webril, and Ace wrap applied. Needle  and sponge count were correct times 2.The patient awakened, extubated, and taken to recovery room without difficulty. Vascular status was normal, pulses 2+ and symmetric.   Kealani Leckey A 01/05/2015, 8:50 AM

## 2015-01-05 NOTE — Anesthesia Preprocedure Evaluation (Signed)
Anesthesia Evaluation  Patient identified by MRN, date of birth, ID band Patient awake    Reviewed: Allergy & Precautions, NPO status , Patient's Chart, lab work & pertinent test results  History of Anesthesia Complications (+) PONV  Airway Mallampati: I  TM Distance: >3 FB Neck ROM: Full    Dental   Pulmonary    Pulmonary exam normal        Cardiovascular Normal cardiovascular exam     Neuro/Psych    GI/Hepatic   Endo/Other    Renal/GU      Musculoskeletal   Abdominal   Peds  Hematology   Anesthesia Other Findings   Reproductive/Obstetrics                             Anesthesia Physical Anesthesia Plan  ASA: II  Anesthesia Plan: Spinal   Post-op Pain Management: MAC Combined w/ Regional for Post-op pain   Induction: Intravenous  Airway Management Planned:   Additional Equipment:   Intra-op Plan:   Post-operative Plan:   Informed Consent: I have reviewed the patients History and Physical, chart, labs and discussed the procedure including the risks, benefits and alternatives for the proposed anesthesia with the patient or authorized representative who has indicated his/her understanding and acceptance.     Plan Discussed with: CRNA and Surgeon  Anesthesia Plan Comments:         Anesthesia Quick Evaluation

## 2015-01-06 ENCOUNTER — Encounter (HOSPITAL_COMMUNITY): Payer: Self-pay | Admitting: General Practice

## 2015-01-06 HISTORY — PX: TOTAL KNEE ARTHROPLASTY: SHX125

## 2015-01-06 LAB — CBC
HEMATOCRIT: 29.1 % — AB (ref 36.0–46.0)
HEMOGLOBIN: 9.6 g/dL — AB (ref 12.0–15.0)
MCH: 29.7 pg (ref 26.0–34.0)
MCHC: 33 g/dL (ref 30.0–36.0)
MCV: 90.1 fL (ref 78.0–100.0)
Platelets: 319 10*3/uL (ref 150–400)
RBC: 3.23 MIL/uL — AB (ref 3.87–5.11)
RDW: 13.4 % (ref 11.5–15.5)
WBC: 11 10*3/uL — AB (ref 4.0–10.5)

## 2015-01-06 LAB — BASIC METABOLIC PANEL
ANION GAP: 9 (ref 5–15)
BUN: 12 mg/dL (ref 6–20)
CHLORIDE: 109 mmol/L (ref 101–111)
CO2: 19 mmol/L — AB (ref 22–32)
CREATININE: 0.69 mg/dL (ref 0.44–1.00)
Calcium: 9.5 mg/dL (ref 8.9–10.3)
GFR calc non Af Amer: >60 mL/min (ref >60)
Glucose, Bld: 167 mg/dL — ABNORMAL HIGH (ref 65–99)
POTASSIUM: 4.8 mmol/L (ref 3.5–5.1)
SODIUM: 137 mmol/L (ref 135–145)

## 2015-01-06 MED ORDER — ONDANSETRON HCL 4 MG PO TABS: 4.0000 mg | ORAL_TABLET | Freq: Four times a day (QID) | ORAL | Status: DC | PRN

## 2015-01-06 MED ORDER — OXYCODONE HCL 5 MG PO TABS: ORAL_TABLET | ORAL | Status: DC

## 2015-01-06 MED ORDER — ENOXAPARIN SODIUM 30 MG/0.3ML ~~LOC~~ SOLN: 30.0000 mg | Freq: Two times a day (BID) | SUBCUTANEOUS | Status: AC

## 2015-01-06 MED ORDER — DOCUSATE SODIUM 100 MG PO CAPS: ORAL_CAPSULE | ORAL | Status: DC

## 2015-01-06 MED ORDER — POLYETHYLENE GLYCOL 3350 17 G PO PACK
17.0000 g | PACK | Freq: Two times a day (BID) | ORAL | Status: DC
Start: 2015-01-06 — End: 2015-07-30

## 2015-01-06 MED ORDER — OXYCODONE HCL ER 20 MG PO T12A: EXTENDED_RELEASE_TABLET | ORAL | Status: DC

## 2015-01-06 NOTE — Discharge Instructions (Signed)
INSTRUCTIONS AFTER JOINT REPLACEMENT   o Remove items at home which could result in a fall. This includes throw rugs or furniture in walking pathways o ICE to the affected joint every three hours while awake for 30 minutes at a time, for at least the first 3-5 days, and then as needed for pain and swelling.  Continue to use ice for pain and swelling. You may notice swelling that will progress down to the foot and ankle.  This is normal after surgery.  Elevate your leg when you are not up walking on it.   o Continue to use the breathing machine you got in the hospital (incentive spirometer) which will help keep your temperature down.  It is common for your temperature to cycle up and down following surgery, especially at night when you are not up moving around and exerting yourself.  The breathing machine keeps your lungs expanded and your temperature down.   DIET:  As you were doing prior to hospitalization, we recommend a well-balanced diet.  DRESSING / WOUND CARE / SHOWERING  Keep the surgical dressing until follow up.  The dressing is water proof, so you can shower without any extra covering.  IF THE DRESSING FALLS OFF or the wound gets wet inside, change the dressing with sterile gauze.  Please use good hand washing techniques before changing the dressing.  Do not use any lotions or creams on the incision until instructed by your surgeon.    ACTIVITY  o Increase activity slowly as tolerated, but follow the weight bearing instructions below.   o No driving for 6 weeks or until further direction given by your physician.  You cannot drive while taking narcotics.  o No lifting or carrying greater than 10 lbs. until further directed by your surgeon. o Avoid periods of inactivity such as sitting longer than an hour when not asleep. This helps prevent blood clots.  o You may return to work once you are authorized by your doctor.     WEIGHT BEARING  Weight bearing as tolerated.  Use a walker for  about a week then a cane.   Progress off cane as soon as gait is normalized.   EXERCISES  Results after joint replacement surgery are often greatly improved when you follow the exercise, range of motion and muscle strengthening exercises prescribed by your doctor. Safety measures are also important to protect the joint from further injury. Any time any of these exercises cause you to have increased pain or swelling, decrease what you are doing until you are comfortable again and then slowly increase them. If you have problems or questions, call your caregiver or physical therapist for advice.   Rehabilitation is important following a joint replacement. After just a few days of immobilization, the muscles of the leg can become weakened and shrink (atrophy).  These exercises are designed to build up the tone and strength of the thigh and leg muscles and to improve motion. Often times heat used for twenty to thirty minutes before working out will loosen up your tissues and help with improving the range of motion but do not use heat for the first two weeks following surgery (sometimes heat can increase post-operative swelling).   These exercises can be done on a training (exercise) mat, on the floor, on a table or on a bed. Use whatever works the best and is most comfortable for you.    Use music or television while you are exercising so that the exercises are  a pleasant break in your day. This will make your life better with the exercises acting as a break in your routine that you can look forward to.   Perform all exercises about fifteen times, three times per day or as directed.  You should exercise both the operative leg and the other leg as well.  Exercises include:    Quad Sets - Tighten up the muscle on the front of the thigh (Quad) and hold for 5-10 seconds.    Straight Leg Raises - With your knee straight (if you were given a brace, keep it on), lift the leg to 60 degrees, hold for 3 seconds,  and slowly lower the leg.  Perform this exercise against resistance later as your leg gets stronger.   Leg Slides: Lying on your back, slowly slide your foot toward your buttocks, bending your knee up off the floor (only go as far as is comfortable). Then slowly slide your foot back down until your leg is flat on the floor again.   Angel Wings: Lying on your back spread your legs to the side as far apart as you can without causing discomfort.   Hamstring Strength:  Lying on your back, push your heel against the floor with your leg straight by tightening up the muscles of your buttocks.  Repeat, but this time bend your knee to a comfortable angle, and push your heel against the floor.  You may put a pillow under the heel to make it more comfortable if necessary.   A rehabilitation program following joint replacement surgery can speed recovery and prevent re-injury in the future due to weakened muscles. Contact your doctor or a physical therapist for more information on knee rehabilitation.    CONSTIPATION  Constipation is defined medically as fewer than three stools per week and severe constipation as less than one stool per week.  Even if you have a regular bowel pattern at home, your normal regimen is likely to be disrupted due to multiple reasons following surgery.  Combination of anesthesia, postoperative narcotics, change in appetite and fluid intake all can affect your bowels.   YOU MUST use at least one of the following options; they are listed in order of increasing strength to get the job done.  They are all available over the counter, and you may need to use some, POSSIBLY even all of these options:    Drink plenty of fluids (prune juice may be helpful) and high fiber foods Colace 100 mg by mouth twice a day  Senokot for constipation as directed and as needed Dulcolax (bisacodyl), take with full glass of water  Miralax (polyethylene glycol) once or twice a day as needed.  If you have  tried all these things and are unable to have a bowel movement in the first 3-4 days after surgery call either your surgeon or your primary doctor.    If you experience loose stools or diarrhea, hold the medications until you stool forms back up.  If your symptoms do not get better within 1 week or if they get worse, check with your doctor.  If you experience "the worst abdominal pain ever" or develop nausea or vomiting, please contact the office immediately for further recommendations for treatment.   ITCHING:  If you experience itching with your medications, try taking only a single pain pill, or even half a pain pill at a time.  You can also use Benadryl over the counter for itching or also to help with sleep.  TED HOSE STOCKINGS:  Use stockings on both legs until for at least 2 weeks or as directed by physician office. They may be removed at night for sleeping.  MEDICATIONS:  See your medication summary on the After Visit Summary that nursing will review with you.  You may have some home medications which will be placed on hold until you complete the course of blood thinner medication.  It is important for you to complete the blood thinner medication as prescribed.  PRECAUTIONS:  If you experience chest pain or shortness of breath - call 911 immediately for transfer to the hospital emergency department.   If you develop a fever greater that 101 F, purulent drainage from wound, increased redness or drainage from wound, foul odor from the wound/dressing, or calf pain - CONTACT YOUR SURGEON.                                                   FOLLOW-UP APPOINTMENTS:  If you do not already have a post-op appointment, please call the office for an appointment to be seen by your surgeon.  Guidelines for how soon to be seen are listed in your After Visit Summary, but are typically between 1-4 weeks after surgery.  OTHER INSTRUCTIONS:   Knee Replacement:  Do not place pillow under knee, focus on  keeping the knee straight while resting. CPM instructions: 0-90 degrees, 2 hours in the morning, 2 hours in the afternoon, and 2 hours in the evening. Place foam block, curve side up under heel at all times except when in CPM or when walking.  DO NOT modify, tear, cut, or change the foam block in any way.  MAKE SURE YOU:   Understand these instructions.   Get help right away if you are not doing well or get worse.    Thank you for letting us be a part of your medical care team.  It is a privilege we respect greatly.  We hope these instructions will help you stay on track for a fast and full recovery!

## 2015-01-06 NOTE — Progress Notes (Signed)
Physical Therapy Treatment Patient Details Name: Dana Blanchard MRN: [id number] DOB: [birth year] Today's Date: 01/06/2015    History of Present Illness Pt admitted for R TKA. PMHx: anxiety, breast CA, mastectomy and reconstruction    PT Comments    Patient is progressing well toward PT goals with ability to ambulate 123ft and ascend/descend 2 steps. Overall mobility level of min guard/supervision. Continue to progress as tolerated.   Follow Up Recommendations  Home health PT     Equipment Recommendations  Rolling walker with 5" wheels;3in1 (PT)    Recommendations for Other Services       Precautions / Restrictions Precautions Precautions: Knee;Fall Precaution Comments: Reviewed precautions, no pillow under knee, and use of zero degree bone foam Required Braces or Orthoses: Knee Immobilizer - Right Restrictions Weight Bearing Restrictions: Yes RLE Weight Bearing: Weight bearing as tolerated    Mobility  Bed Mobility               General bed mobility comments: Pt OOB in chair   Transfers Overall transfer level: Needs assistance Equipment used: Rolling walker (2 wheeled) Transfers: Sit to/from Stand Sit to Stand: Supervision         General transfer comment: Good hand placement and technique   Ambulation/Gait Ambulation/Gait assistance: Min guard Ambulation Distance (Feet): 150 Feet Assistive device: Rolling walker (2 wheeled) Gait Pattern/deviations: Step-through pattern;Decreased stride length;Antalgic Gait velocity: decreased   General Gait Details: pt with good upright posture, cues for righting eyes initially   Stairs Stairs: Yes Stairs assistance: Min guard Stair Management: No rails;Backwards;With walker Number of Stairs: 2    Wheelchair Mobility    Modified Rankin (Stroke Patients Only)       Balance Overall balance assessment: Needs assistance Sitting-balance support: Feet supported Sitting balance-Leahy Scale: Good      Standing balance support: During functional activity Standing balance-Leahy Scale: Fair Standing balance comment: able to perform hand hygiene at sink without UE support                    Cognition Arousal/Alertness: Awake/alert Behavior During Therapy: WFL for tasks assessed/performed Overall Cognitive Status: Within Functional Limits for tasks assessed                      Exercises Total Joint Exercises Quad Sets: AROM;Right;10 reps Heel Slides: AROM;Right;10 reps Hip ABduction/ADduction: AROM;Right;10 reps Straight Leg Raises: AROM;Right;10 reps Long Arc Quad: AROM;Right;10 reps Knee Flexion: AROM;Right;10 reps;Seated;AAROM (L LE assisted stretch) Goniometric ROM: 0-60    General Comments        Pertinent Vitals/Pain Pain Assessment: 0-10 Pain Score: 7  Pain Location: R Knee Pain Descriptors / Indicators: Sore Pain Intervention(s): Limited activity within patient's tolerance;Monitored during session;Patient requesting pain meds-RN notified    Home Living                      Prior Function            PT Goals (current goals can now be found in the care plan section) Acute Rehab PT Goals Patient Stated Goal: return to independence PT Goal Formulation: With patient/family Time For Goal Achievement: 01/12/15 Potential to Achieve Goals: Good Progress towards PT goals: Progressing toward goals    Frequency  7X/week    PT Plan Current plan remains appropriate    Co-evaluation             End of Session Equipment Utilized During Treatment: Gait belt Activity Tolerance: Patient tolerated  treatment well Patient left: in chair;with call bell/phone within reach     Time: 0905-0940 PT Time Calculation (min) (ACUTE ONLY): 35 min  Charges:  $Gait Training: 8-22 mins $Therapeutic Exercise: 8-22 mins                    G Codes:      Quintisha Goodenow, PTA Pager: [phone]   01/06/2015, 9:59 AM

## 2015-01-06 NOTE — Progress Notes (Signed)
Orthopedic Tech Progress Note Patient Details:  Dana Blanchard Jan 17, 1956 [id number] Placed pt rle on com @ 0-070 degrees @ 1345 Patient ID: Dana Blanchard, female   DOB: November 09, 1955, 59 y.o.   MRN: [id number]   Dana Blanchard 01/06/2015, 1:43 PM

## 2015-01-06 NOTE — Discharge Summary (Signed)
Patient ID: Dana Blanchard MRN: [id number] DOB/AGE: 01-26-1955 59 y.o.  Admit date: 01/05/2015 Discharge date: 01/07/2015  Admission Diagnoses:  Active Problems:   Primary localized osteoarthritis of right knee   Anxiety   DJD (degenerative joint disease) of knee   Post-operative nausea and vomiting   Discharge Diagnoses:  Same  Past Medical History  Diagnosis Date  . Primary localized osteoarthritis of right knee   . Breast cancer ([place])   . Complication of anesthesia   . PONV (postoperative nausea and vomiting)   . Depression   . Anxiety     +panic attack- yes at dentist office & before surgery   . H/O renal calculi   . Headache   . Anemia     /w chemo- anemic, blood transfusion post op- mastectomy   . Post-operative nausea and vomiting 01/07/2015    Patient did not discharge on the 20th because of episodes of vomiting.  No vomiting on 21    Plan to D/C home    Surgeries: Procedure(s): RIGHT TOTAL KNEE ARTHROPLASTY on 01/05/2015   Consultants:    Discharged Condition: Improved  Hospital Course: Dana Blanchard is an 59 y.o. female who was admitted 01/05/2015 for operative treatment of<principal problem not specified>. Patient has severe unremitting pain that affects sleep, daily activities, and work/hobbies. After pre-op clearance the patient was taken to the operating room on 01/05/2015 and underwent  Procedure(s): RIGHT TOTAL KNEE ARTHROPLASTY.    Patient was given perioperative antibiotics:      Anti-infectives    Start     Dose/Rate Route Frequency Ordered Stop   01/05/15 1330  ceFAZolin (ANCEF) IVPB 2 g/50 mL premix     2 g 100 mL/hr over 30 Minutes Intravenous Every 6 hours 01/05/15 1026 01/05/15 2116   01/05/15 0500  ceFAZolin (ANCEF) IVPB 2 g/50 mL premix     2 g 100 mL/hr over 30 Minutes Intravenous To ShortStay [facility] 01/04/15 1504 01/05/15 0750       Patient was given sequential compression devices, early ambulation, and chemoprophylaxis to  prevent DVT.  Patient did very well with ambulation and motion.  She tried to discharge on 12/20 however due to multiple episodes of vomiting she was kept another day for IV fluids and nausea meds.  Patient benefited maximally from hospital stay and there were no complications.    Recent vital signs:  Patient Vitals for the past 24 hrs:  BP Temp Temp src Pulse Resp SpO2  01/07/15 0612 122/66 mmHg 98.8 F (37.1 C) Oral 90 16 94 %  01/06/15 2159 138/78 mmHg 97.9 F (36.6 C) Oral 91 16 96 %  01/06/15 1528 129/63 mmHg 98.7 F (37.1 C) Oral 96 16 94 %  01/06/15 0653 - - - - - 97 %     Recent laboratory studies:   Recent Labs  01/06/15 0555 01/07/15 0521  WBC 11.0* 12.3*  HGB 9.6* 9.6*  HCT 29.1* 28.6*  PLT 319 333  NA 137 140  K 4.8 4.0  CL 109 106  CO2 19* 26  BUN 12 14  CREATININE 0.69 0.64  GLUCOSE 167* 93  CALCIUM 9.5 9.4     Discharge Medications:     Medication List    STOP taking these medications        CYANOCOBALAMIN PO     HYDROcodone-acetaminophen 5-325 MG tablet  Commonly known as:  NORCO/VICODIN     naproxen sodium 220 MG tablet  Commonly known as:  ANAPROX     TURMERIC  PO      TAKE these medications        buPROPion 150 MG 12 hr tablet  Commonly known as:  WELLBUTRIN SR  Take 150 mg by mouth daily before breakfast.     cetirizine-pseudoephedrine 5-120 MG tablet  Commonly known as:  ZYRTEC-D  Take 1 tablet by mouth 2 (two) times daily.     cholecalciferol 1000 UNITS tablet  Commonly known as:  VITAMIN D  Take 1,000 Units by mouth daily.     citalopram 10 MG tablet  Commonly known as:  CELEXA  Take 10 mg by mouth daily before breakfast.     docusate sodium 100 MG capsule  Commonly known as:  COLACE  1 tab 2 times a day while on narcotics.  STOOL SOFTENER     enoxaparin 30 MG/0.3ML injection  Commonly known as:  LOVENOX  Inject 0.3 mLs (30 mg total) into the skin every 12 (twelve) hours.     fenofibrate 160 MG tablet  Take 160 mg  by mouth daily.     MAGNESIUM PO  Take 1 tablet by mouth daily.     Melatonin 3 MG Tabs  Take 1 tablet by mouth at bedtime.     meloxicam 15 MG tablet  Commonly known as:  MOBIC  Take 15 mg by mouth daily.     ondansetron 4 MG tablet  Commonly known as:  ZOFRAN  Take 1 tablet (4 mg total) by mouth every 6 (six) hours as needed for nausea.     oxyCODONE 5 MG immediate release tablet  Commonly known as:  Oxy IR/ROXICODONE  1-2 tablets every 4-6 hrs as needed for pain     oxyCODONE 20 mg 12 hr tablet  Commonly known as:  OXYCONTIN  1 po every 12 hrs.   LONG ACTING PAIN MEDICATION     polyethylene glycol packet  Commonly known as:  MIRALAX / GLYCOLAX  Take 17 g by mouth 2 (two) times daily.     rizatriptan 10 MG tablet  Commonly known as:  MAXALT  Take 10 mg by mouth as needed. May repeat every 2 hours as needed (max 3 tabs in 24 hours)        Diagnostic Studies: No results found.  Disposition: Final discharge disposition not confirmed  Discharge Instructions    CPM    Complete by:  As directed   Continuous passive motion machine (CPM):      Use the CPM from 0 to 90 for 6 hours per day.       You may break it up into 2 or 3 sessions per day.      Use CPM for 2 weeks or until you are told to stop.     Call MD / Call 911    Complete by:  As directed   If you experience chest pain or shortness of breath, CALL 911 and be transported to the hospital emergency room.  If you develope a fever above 101 F, pus (white drainage) or increased drainage or redness at the wound, or calf pain, call your surgeon's office.     Change dressing    Complete by:  As directed   Change the gauze dressing daily with sterile 4 x 4 inch gauze and apply TED hose.  DO NOT REMOVE BANDAGE OVER SURGICAL INCISION.  [place] WHOLE LEG INCLUDING OVER THE WATERPROOF BANDAGE WITH SOAP AND WATER EVERY DAY.     Constipation Prevention    Complete by:  As directed   Drink plenty of fluids.  Prune juice may be  helpful.  You may use a stool softener, such as Colace (over the counter) 100 mg twice a day.  Use MiraLax (over the counter) for constipation as needed.     Diet - low sodium heart healthy    Complete by:  As directed      Discharge instructions    Complete by:  As directed   Total Knee Replacement Care After Refer to this sheet in the next few weeks. These discharge instructions provide you with general information on caring for yourself after you leave the hospital. Your caregiver may also give you specific instructions. Your treatment has been planned according to the most current medical practices available, but unavoidable complications sometimes occur. If you have any problems or questions after discharge, please call your caregiver. Regaining a near full range of motion of your knee within the first 3 to 6 weeks after surgery is critical. [place] may resume a normal diet and activities as directed.  Perform exercises as directed.  Place gray foam block, curve side up under heel at all times except when in CPM or when walking.  DO NOT modify, tear, cut, or change in any way the gray foam block. You will receive physical therapy daily  Take showers instead of baths until informed otherwise.  You may shower on Friday.  Please wash whole leg including wound with soap and water over aquacel dressing.  DO NOT REMOVE AQUACEL DRESSING.  DR Noemi Chapel WILL TAKE IT OFF IN THE OFFICE AT THE POST OP APPOINTMENT  It is OK to take over-the-counter tylenol in addition to the oxycodone for pain, discomfort, or fever. Oxycodone is VERY constipating.  Please take stool softener twice a day and laxatives daily until bowels are regular Eat a well-balanced diet.  Avoid lifting or driving until you are instructed otherwise.  Make an appointment to see your caregiver for stitches (suture) or staple removal as directed.  If you have been sent home with a continuous passive motion machine (CPM  machine), 0-90 degrees 6 hrs a day   2 hrs a shift SEEK MEDICAL CARE IF: You have swelling of your calf or leg.  You develop shortness of breath or chest pain.  You have redness, swelling, or increasing pain in the wound.  There is pus or any unusual drainage coming from the surgical site.  You notice a bad smell coming from the surgical site or dressing.  The surgical site breaks open after sutures or staples have been removed.  There is persistent bleeding from the suture or staple line.  You are getting worse or are not improving.  You have any other questions or concerns.  SEEK IMMEDIATE MEDICAL CARE IF:  You have a fever.  You develop a rash.  You have difficulty breathing.  You develop any reaction or side effects to medicines given.  Your knee motion is decreasing rather than improving.  MAKE SURE YOU:  Understand these instructions.  Will watch your condition.  Will get help right away if you are not doing well or get worse.     Do not put a pillow under the knee. Place it under the heel.    Complete by:  As directed   Place gray foam block, curve side up under heel at all times except when in CPM or when walking.  DO NOT modify, tear, cut, or change in any way the  gray foam block.     Increase activity slowly as tolerated    Complete by:  As directed      TED hose    Complete by:  As directed   Use stockings (TED hose) for 2 weeks on both leg(s).  You may remove them at night for sleeping.           Follow-up Information    Follow up with Lorn Junes, MD On 01/19/2015.   Specialty:  Orthopedic Surgery   Why:  APPT TIME 2 PM   Contact information:   [place] [address] [phone]       Follow up with [facility].   Why:  Someone from [facility] will contact you concerning start date and time for therapy.   Contact information:   [address] [place] [address] [phone]         Signed: Linda Hedges 01/07/2015, 6:46 AM

## 2015-01-06 NOTE — Care Management Note (Signed)
Case Management Note  Patient Details  Name: Dana Blanchard MRN: [id number] Date of Birth: 1955-06-08  Subjective/Objective:  59 yr old female s/p right total knee arthroplasty.                  Action/Plan:  Case manager spoke with patient concerning home health and DME needs for discharge. Choice was offered, patient was preoperatively setup with [facility], no changes. She will have family support at discharge.     Expected Discharge Date:   01/07/15               Expected Discharge Plan:  [place]  In-House Referral:  NA  Discharge planning Services  CM Consult  Post Acute Care Choice:  Durable Medical Equipment, Home Health Choice offered to:  Patient  DME Arranged:  3-N-1, Walker rolling, CPM DME Agency:  [employer]  HH Arranged:  PT HH Agency:  [facility]  Status of Service:  In process, will continue to follow  Medicare Important Message Given:    Date Medicare IM Given:    Medicare IM give by:    Date Additional Medicare IM Given:    Additional Medicare Important Message give by:     If discussed at [place] of Stay Meetings, dates discussed:    Additional Comments:  Ninfa Meeker, RN 01/06/2015, 11:28 AM

## 2015-01-06 NOTE — Progress Notes (Signed)
Occupational Therapy Treatment Patient Details Name: Dana Blanchard MRN: [id number] DOB: [birth year] Today's Date: 01/06/2015    History of present illness Pt admitted for R TKA. PMHx: anxiety, breast CA, mastectomy and reconstruction   OT comments  Educated pt on tub transfer with use of 3 in 1; pt able to return demo technique. Pt with no further questions or concerns for OT at this time. All goals have been met and education completed; pt to be d/c from OT services at this time. Pt ready to d/c from OT standpoint; signing off.   Follow Up Recommendations  No OT follow up;Supervision - Intermittent    Equipment Recommendations  3 in 1 bedside comode    Recommendations for Other Services      Precautions / Restrictions Precautions Precautions: Knee;Fall Precaution Comments: Reviewed precautions, no pillow under knee, and use of zero degree bone foam Required Braces or Orthoses: Knee Immobilizer - Right Restrictions Weight Bearing Restrictions: Yes RLE Weight Bearing: Weight bearing as tolerated       Mobility Bed Mobility               General bed mobility comments: Pt in bathroom upon arrival. In chair at end of session  Transfers Overall transfer level: Needs assistance Equipment used: Rolling walker (2 wheeled) Transfers: Sit to/from Stand Sit to Stand: Supervision         General transfer comment: Good hand placement and technique. Sit to stand from [facility] x 1, chair x 1    Balance Overall balance assessment: Needs assistance Sitting-balance support: Feet supported Sitting balance-Leahy Scale: Good     Standing balance support: During functional activity;No upper extremity supported Standing balance-Leahy Scale: Fair Standing balance comment: able to perform hand hygiene at sink without UE support                   ADL Overall ADL's : Needs assistance/impaired     Grooming: Supervision/safety;Standing;Wash/dry hands           Upper Body  Dressing : Supervision/safety;Standing Upper Body Dressing Details (indicate cue type and reason): Donned/doffed hospital gown in standing             Tub/ Shower Transfer: Supervision/safety;Tub transfer;Ambulation;3 in 1;Rolling walker Tub/Shower Transfer Details (indicate cue type and reason): Educated and demonstrated use of 3 in 1 in tub as a seat, provided pt with handout; pt able to return demo technique with supervision for safety. Functional mobility during ADLs: Supervision/safety;Rolling walker General ADL Comments: No family present for OT session. Educated on need for close supervision for safety during tub transfer; pt verbalized understanding      Vision                     Perception     Praxis      Cognition   Behavior During Therapy: [facility] for tasks assessed/performed Overall Cognitive Status: Within Functional Limits for tasks assessed                       Extremity/Trunk Assessment               Exercises Total Joint Exercises Quad Sets: AROM;Right;10 reps Heel Slides: AROM;Right;10 reps Hip ABduction/ADduction: AROM;Right;10 reps Straight Leg Raises: AROM;Right;10 reps Long Arc Quad: AROM;Right;10 reps Knee Flexion: AROM;Right;10 reps;Seated;AAROM (L LE assisted stretch) Goniometric ROM: 0-60   Shoulder Instructions       General Comments      Pertinent Vitals/ Pain  Pain Assessment: Faces Pain Score: 7  Faces Pain Scale: Hurts little more Pain Location: R knee Pain Descriptors / Indicators: Sore (+nausea) Pain Intervention(s): Limited activity within patient's tolerance;Monitored during session;Ice applied;Repositioned  Home Living                                          Prior Functioning/Environment              Frequency       Progress Toward Goals  OT Goals(current goals can now be found in the care plan section)  Progress towards OT goals: Goals met/education completed, patient  discharged from OT  Acute Rehab OT Goals Patient Stated Goal: to go home soon OT Goal Formulation: With patient  Plan All goals met and education completed, patient discharged from OT services    Co-evaluation                 End of Session Equipment Utilized During Treatment: Rolling walker;Gait belt   Activity Tolerance Patient tolerated treatment well   Patient Left in chair;with call bell/phone within reach (zero degree bone foam applied to RLE)   Nurse Communication          Time: 1820-9906 OT Time Calculation (min): 16 min  Charges: OT General Charges $OT Visit: 1 Procedure OT Treatments $Self Care/Home Management : 8-22 mins  Binnie Kand M.S., OTR/L Pager: [phone]  01/06/2015, 12:35 PM

## 2015-01-06 NOTE — Progress Notes (Signed)
Physical Therapy Treatment Patient Details Name: Dana Blanchard MRN: [id number] DOB: 11-10-1955 Today's Date: 01/06/2015    History of Present Illness Pt admitted for R TKA. PMHx: anxiety, breast CA, mastectomy and reconstruction    PT Comments    Patient continues to do well with PT. Pt and daughter received stair training. Pt reported daughter will be primary assistance at home and both feel comfortable managing 2 steps to enter pt's home. Current plan remains appropriate.  Follow Up Recommendations  Home health PT     Equipment Recommendations  Rolling walker with 5" wheels;3in1 (PT)    Recommendations for Other Services       Precautions / Restrictions Precautions Precautions: Knee;Fall Precaution Comments: Reviewed precautions, no pillow under knee, and use of zero degree bone foam Required Braces or Orthoses: Knee Immobilizer - Right Restrictions Weight Bearing Restrictions: Yes RLE Weight Bearing: Weight bearing as tolerated    Mobility  Bed Mobility Overal bed mobility: Modified Independent             General bed mobility comments: HOB flat and no use of bed rails and no physical assistance needed; increased time needed  Transfers Overall transfer level: Needs assistance Equipment used: Rolling walker (2 wheeled) Transfers: Sit to/from Stand Sit to Stand: Supervision         General transfer comment: Good hand placement and technique   Ambulation/Gait Ambulation/Gait assistance: Supervision Ambulation Distance (Feet): 100 Feet (X 2) Assistive device: Rolling walker (2 wheeled) Gait Pattern/deviations: Step-through pattern Gait velocity: decreased   General Gait Details: pt with improved gait mechanics and increased cadence this session   Stairs Stairs: Yes Stairs assistance: Min guard Stair Management: No rails;Backwards;With walker Number of Stairs: 2 (X 2) General stair comments: daughter present for session and participated in stair  training; daughter will be primary caregiver when returned home  Wheelchair Mobility    Modified Rankin (Stroke Patients Only)       Balance Overall balance assessment: Needs assistance Sitting-balance support: Feet supported Sitting balance-Leahy Scale: Good     Standing balance support: During functional activity Standing balance-Leahy Scale: Fair                      Cognition Arousal/Alertness: Awake/alert Behavior During Therapy: WFL for tasks assessed/performed Overall Cognitive Status: Within Functional Limits for tasks assessed                      Exercises      General Comments        Pertinent Vitals/Pain Pain Assessment: No/denies pain Faces Pain Scale: Hurts little more Pain Location: R knee Pain Descriptors / Indicators: Sore (+nausea) Pain Intervention(s): Limited activity within patient's tolerance;Monitored during session;Ice applied;Repositioned    Home Living                      Prior Function            PT Goals (current goals can now be found in the care plan section) Acute Rehab PT Goals Patient Stated Goal: return to independence PT Goal Formulation: With patient/family Time For Goal Achievement: 01/12/15 Potential to Achieve Goals: Good Progress towards PT goals: Progressing toward goals    Frequency  7X/week    PT Plan Current plan remains appropriate    Co-evaluation             End of Session Equipment Utilized During Treatment: Gait belt Activity Tolerance: Patient tolerated treatment well  Patient left: with call bell/phone within reach;in bed;in CPM;with family/visitor present     Time: [id number] PT Time Calculation (min) (ACUTE ONLY): 22 min  Charges:  $Gait Training: 8-22 mins                    G Codes:      Merisa Dhanraj, PTA Pager: [phone]   01/06/2015, 3:31 PM

## 2015-01-07 ENCOUNTER — Encounter (HOSPITAL_COMMUNITY): Payer: Self-pay | Admitting: Physician Assistant

## 2015-01-07 DIAGNOSIS — Z9889 Other specified postprocedural states: Secondary | ICD-10-CM

## 2015-01-07 DIAGNOSIS — R112 Nausea with vomiting, unspecified: Secondary | ICD-10-CM

## 2015-01-07 HISTORY — DX: Nausea with vomiting, unspecified: R11.2

## 2015-01-07 HISTORY — DX: Other specified postprocedural states: Z98.890

## 2015-01-07 LAB — CBC
HCT: 28.6 % — ABNORMAL LOW (ref 36.0–46.0)
HEMOGLOBIN: 9.6 g/dL — AB (ref 12.0–15.0)
MCH: 30.1 pg (ref 26.0–34.0)
MCHC: 33.6 g/dL (ref 30.0–36.0)
MCV: 89.7 fL (ref 78.0–100.0)
Platelets: 333 10*3/uL (ref 150–400)
RBC: 3.19 MIL/uL — ABNORMAL LOW (ref 3.87–5.11)
RDW: 13.5 % (ref 11.5–15.5)
WBC: 12.3 10*3/uL — ABNORMAL HIGH (ref 4.0–10.5)

## 2015-01-07 LAB — BASIC METABOLIC PANEL
Anion gap: 8 (ref 5–15)
BUN: 14 mg/dL (ref 6–20)
CHLORIDE: 106 mmol/L (ref 101–111)
CO2: 26 mmol/L (ref 22–32)
CREATININE: 0.64 mg/dL (ref 0.44–1.00)
Calcium: 9.4 mg/dL (ref 8.9–10.3)
GFR calc Af Amer: >60 mL/min (ref >60)
GFR calc non Af Amer: >60 mL/min (ref >60)
GLUCOSE: 93 mg/dL (ref 65–99)
POTASSIUM: 4 mmol/L (ref 3.5–5.1)
SODIUM: 140 mmol/L (ref 135–145)

## 2015-01-07 NOTE — Progress Notes (Signed)
Physical Therapy Treatment Patient Details Name: Dana Blanchard MRN: [id number] DOB: 1955-08-22 Today's Date: 01/07/2015    History of Present Illness Pt admitted for R TKA. PMHx: anxiety, breast CA, mastectomy and reconstruction    PT Comments    Patient continues to do well with PT with ability to ambulate 250 ft with improved gait mechanics and to ascend/descend stairs with min cues. Current plan remains appropriate.  Follow Up Recommendations  Home health PT     Equipment Recommendations  Rolling walker with 5" wheels;3in1 (PT)    Recommendations for Other Services       Precautions / Restrictions Precautions Precautions: Knee;Fall Precaution Comments: Reviewed precautions, no pillow under knee, and use of zero degree bone foam Required Braces or Orthoses: Knee Immobilizer - Right Restrictions Weight Bearing Restrictions: Yes RLE Weight Bearing: Weight bearing as tolerated    Mobility  Bed Mobility                  Transfers Overall transfer level: Needs assistance Equipment used: Rolling walker (2 wheeled) Transfers: Sit to/from Stand Sit to Stand: Supervision         General transfer comment: vc for hand placement with initial sit to stand  Ambulation/Gait Ambulation/Gait assistance: Supervision Ambulation Distance (Feet): 250 Feet Assistive device: Rolling walker (2 wheeled) Gait Pattern/deviations: Step-through pattern Gait velocity: decreased   General Gait Details: heavy use of Bilat UE for first 20 ft; vc for upright posture and to increase R knee flexion for initial and midswing phase   Stairs Stairs: Yes Stairs assistance: Min guard Stair Management: No rails;Backwards;With walker Number of Stairs: 2 (X 2) General stair comments: pt with ability to teach back technique with min cues for RW placement  Wheelchair Mobility    Modified Rankin (Stroke Patients Only)       Balance Overall balance assessment: Needs  assistance Sitting-balance support: Feet supported Sitting balance-Leahy Scale: Good     Standing balance support: No upper extremity supported Standing balance-Leahy Scale: Good                      Cognition Arousal/Alertness: Awake/alert Behavior During Therapy: WFL for tasks assessed/performed Overall Cognitive Status: Within Functional Limits for tasks assessed                      Exercises      General Comments        Pertinent Vitals/Pain Pain Assessment: Faces Faces Pain Scale: Hurts a little bit Pain Location: R knee Pain Descriptors / Indicators: Sore Pain Intervention(s): Limited activity within patient's tolerance;Monitored during session;Premedicated before session;Ice applied    Home Living                      Prior Function            PT Goals (current goals can now be found in the care plan section) Acute Rehab PT Goals Patient Stated Goal: return to independence PT Goal Formulation: With patient/family Time For Goal Achievement: 01/12/15 Potential to Achieve Goals: Good Progress towards PT goals: Progressing toward goals    Frequency  7X/week    PT Plan Current plan remains appropriate    Co-evaluation             End of Session Equipment Utilized During Treatment: Gait belt Activity Tolerance: Patient tolerated treatment well Patient left: with call bell/phone within reach;in bed;in CPM;with family/visitor present     Time: [id number]  PT Time Calculation (min) (ACUTE ONLY): 19 min  Charges:  $Gait Training: 8-22 mins                    G Codes:      Christyana Age, PTA Pager: [phone]   01/07/2015, 2:53 PM

## 2015-01-07 NOTE — Progress Notes (Signed)
Physical Therapy Treatment Patient Details Name: Dana Blanchard MRN: [id number] DOB: Feb 18, 1955 Today's Date: 01/07/2015    History of Present Illness Pt admitted for R TKA. PMHx: anxiety, breast CA, mastectomy and reconstruction    PT Comments    Patient led through HEP and reviewed handout. Pt educated on use of CPM, bone foam, ice application, and frequency of HEP. Pt tolerated exercises well required min cues for completion. Pt with improved gait mechanics this session with ability to ambulate 200 ft. Overall mobility level of supervision/mod I. Stair training with son next session if available.   Follow Up Recommendations  Home health PT     Equipment Recommendations  Rolling walker with 5" wheels;3in1 (PT)    Recommendations for Other Services       Precautions / Restrictions Precautions Precautions: Knee;Fall Precaution Comments: Reviewed precautions, no pillow under knee, and use of zero degree bone foam Required Braces or Orthoses: Knee Immobilizer - Right Restrictions Weight Bearing Restrictions: Yes RLE Weight Bearing: Weight bearing as tolerated    Mobility  Bed Mobility Overal bed mobility: Modified Independent             General bed mobility comments: extra time needed; good technique with HOB flat  Transfers Overall transfer level: Needs assistance Equipment used: Rolling walker (2 wheeled) Transfers: Sit to/from Stand Sit to Stand: Supervision         General transfer comment: vc for hand placement with first trial from EOB with carry over next trial; pt with good technique and safety awareness  Ambulation/Gait Ambulation/Gait assistance: Supervision Ambulation Distance (Feet): 200 Feet Assistive device: Rolling walker (2 wheeled) Gait Pattern/deviations: Step-through pattern;Decreased stride length Gait velocity: decreased   General Gait Details: vc for upright posture with ability to correct; pt with more even step lengths and improved  ability to WS onto R LE    Stairs            Wheelchair Mobility    Modified Rankin (Stroke Patients Only)       Balance Overall balance assessment: Needs assistance Sitting-balance support: Feet supported Sitting balance-Leahy Scale: Good     Standing balance support: No upper extremity supported Standing balance-Leahy Scale: Good                      Cognition Arousal/Alertness: Awake/alert Behavior During Therapy: WFL for tasks assessed/performed Overall Cognitive Status: Within Functional Limits for tasks assessed                      Exercises Total Joint Exercises Quad Sets: AROM;Right;10 reps Heel Slides: AROM;Right;10 reps Hip ABduction/ADduction: AROM;Right;15 reps Straight Leg Raises: AROM;Right;10 reps Long Arc Quad: AROM;Right;10 reps Knee Flexion: AROM;Right;10 reps;Standing Goniometric ROM: 0-60 Marching in Standing: AROM;Right;10 reps;Standing    General Comments        Pertinent Vitals/Pain Pain Assessment: Faces Faces Pain Scale: Hurts little more Pain Location: R knee Pain Descriptors / Indicators: Sore Pain Intervention(s): Limited activity within patient's tolerance;Monitored during session;Premedicated before session;Ice applied    Home Living                      Prior Function            PT Goals (current goals can now be found in the care plan section) Acute Rehab PT Goals Patient Stated Goal: return to independence PT Goal Formulation: With patient/family Time For Goal Achievement: 01/12/15 Potential to Achieve Goals: Good Progress towards PT  goals: Progressing toward goals    Frequency  7X/week    PT Plan Current plan remains appropriate    Co-evaluation             End of Session Equipment Utilized During Treatment: Gait belt Activity Tolerance: Patient tolerated treatment well Patient left: with call bell/phone within reach;in bed;in CPM;with family/visitor present     Time:  0824-0901 PT Time Calculation (min) (ACUTE ONLY): 37 min  Charges:  $Gait Training: 8-22 mins $Therapeutic Exercise: 8-22 mins                    G Codes:      Salina April, PTA Pager: [phone]   01/07/2015, 9:17 AM

## 2015-07-24 DIAGNOSIS — M1612 Unilateral primary osteoarthritis, left hip: Secondary | ICD-10-CM | POA: Diagnosis present

## 2015-07-24 NOTE — H&P (Signed)
PREOPERATIVE H&P Patient ID: Dana Blanchard MRN: [id number] DOB/AGE: 1955-09-25 60 y.o.  Chief Complaint: OA LEFT HIP  Planned Procedure Date: 08/11/15 Medical and Cardiac Clearance by Dr. Andria Rhein  HPI: Dana Blanchard is a 60 y.o. female who presents for evaluation of OA LEFT HIP. The patient has a history of pain and functional disability in the left hip due to arthritis and has failed non-surgical conservative treatments for greater than 12 weeks to include NSAID's and/or analgesics, corticosteriod injections and activity modification.  Onset of symptoms was gradual, starting 2 years ago with gradually worsening course since that time.  Patient currently rates pain at 7 out of 10 with activity. Patient has night pain, worsening of pain with activity and weight bearing, pain that interferes with activities of daily living and pain with passive range of motion.  Patient has evidence of severe left hip arthritis.  Bone on bone with some cystic formation in the femoral head by imaging studies. There is no active infection.  History of R TKA by Dr. Noemi Chapel.  She had cracked a tooth prior to surgery and had subsequent infxn.  Treated successfully w/ ABX.   Past Medical History  Diagnosis Date  . Primary localized osteoarthritis of right knee   . Breast cancer ([place])   . Complication of anesthesia   . PONV (postoperative nausea and vomiting)   . Depression   . Anxiety     +panic attack- yes at dentist office & before surgery   . H/O renal calculi   . Headache   . Anemia     /w chemo- anemic, blood transfusion post op- mastectomy   . Post-operative nausea and vomiting 01/07/2015    Patient did not discharge on the 20th because of episodes of vomiting.  No vomiting on 21    Plan to D/C home   Past Surgical History  Procedure Laterality Date  . Tonsillectomy  1975  . Ectopic pregnancy surgery  1993  . Cesarean section  1993  . Kidney stone surgery  2014  . Kidney stone surgery  2015  . Knee  arthroscopy w/ meniscectomy Right 2015  . Mastectomy Left 2001    /w reconstruction, yrs. apart   . Breast lumpectomy Left 2001  . Breast reconstruction Left 2012  . Hernia repair Left 2016  . Total knee arthroplasty Right 01/06/2015  . Dilation and curettage of uterus    . Total knee arthroplasty Right 01/05/2015    Procedure: RIGHT TOTAL KNEE ARTHROPLASTY;  Surgeon: Elsie Saas, MD;  Location: [facility];  Service: Orthopedics;  Laterality: Right;   No Known Allergies Prior to Admission medications   Medication Sig Start Date End Date Taking? Authorizing Provider  buPROPion (WELLBUTRIN SR) 150 MG 12 hr tablet Take 150 mg by mouth daily before breakfast.     Historical Provider, MD  cetirizine-pseudoephedrine (ZYRTEC-D) 5-120 MG tablet Take 1 tablet by mouth 2 (two) times daily.    Historical Provider, MD  cholecalciferol (VITAMIN D) 1000 UNITS tablet Take 1,000 Units by mouth daily.    Historical Provider, MD  citalopram (CELEXA) 10 MG tablet Take 10 mg by mouth daily before breakfast.     Historical Provider, MD  docusate sodium (COLACE) 100 MG capsule 1 tab 2 times a day while on narcotics.  STOOL SOFTENER 01/06/15   Kirstin Shepperson, PA-C  enoxaparin (LOVENOX) 30 MG/0.3ML injection Inject 0.3 mLs (30 mg total) into the skin every 12 (twelve) hours. 01/06/15   Kirstin Shepperson, PA-C  fenofibrate 160 MG  tablet Take 160 mg by mouth daily.    Historical Provider, MD  MAGNESIUM PO Take 1 tablet by mouth daily.    Historical Provider, MD  Melatonin 3 MG TABS Take 1 tablet by mouth at bedtime.    Historical Provider, MD  meloxicam (MOBIC) 15 MG tablet Take 15 mg by mouth daily.    Historical Provider, MD  ondansetron (ZOFRAN) 4 MG tablet Take 1 tablet (4 mg total) by mouth every 6 (six) hours as needed for nausea. 01/06/15   Kirstin Shepperson, PA-C  oxyCODONE (OXY IR/ROXICODONE) 5 MG immediate release tablet 1-2 tablets every 4-6 hrs as needed for pain 01/06/15   Kirstin Shepperson, PA-C   oxyCODONE (OXYCONTIN) 20 mg 12 hr tablet 1 po every 12 hrs.   LONG ACTING PAIN MEDICATION 01/06/15   Kirstin Shepperson, PA-C  polyethylene glycol (MIRALAX / GLYCOLAX) packet Take 17 g by mouth 2 (two) times daily. 01/06/15   Kirstin Shepperson, PA-C  rizatriptan (MAXALT) 10 MG tablet Take 10 mg by mouth as needed. May repeat every 2 hours as needed (max 3 tabs in 24 hours) 12/05/14   Historical Provider, MD   Social History   Social History  . Marital Status: Married    Spouse Name: N/A  . Number of Children: N/A  . Years of Education: N/A   Occupational History  . school counselor-retired    Social History Main Topics  . Smoking status: Never Smoker   . Smokeless tobacco: Never Used  . Alcohol Use: 0.0 oz/week    0 Standard drinks or equivalent per week     Comment: socially- q1-2 months   . Drug Use: No  . Sexual Activity: Not on file   Other Topics Concern  . Not on file   Social History Narrative   Family History  Problem Relation Age of Onset  . Diabetes Mother   . Stroke Mother   . Breast cancer Mother   . Stroke Father   . Colon cancer Father     ROS: Currently denies lightheadedness, dizziness, Fever, chills, CP, SOB.   No personal history of DVT, PE, MI, or CVA. No loose teeth or dentures. All other systems have been reviewed and were otherwise negative with the exception of those mentioned in the HPI and as above.  Objective: Vitals: Ht: 5'4" Wt: 217 Temp: 98.1 BP: 129/84 Pulse: 97 O2 97% on room air. Physical Exam: General: Alert, NAD. Trendelenberg Gait  HEENT: EOMI, Good Neck Extension  Pulm: No increased work of breathing.  Clear B/L A/P w/o crackle or wheeze.  CV: RRR, No m/g/r appreciated  GI: soft, NT, ND Neuro: Neuro grossly intact b/l upper/lower ext.  Sensation intact distally Skin: No lesions in the area of chief complaint MSK/Surgical Site: Non tender over greater trochanter.  Pain with passive ROM.  Positive Stinchfield.  5/5 strength.   NVI.  Sensation intact distally.  Imaging Review Plain radiographs demonstrate severe left hip arthritis.  Bone on bone with some cystic formation in the femoral head.   Assessment: OA LEFT HIP Principal Problem:   Primary osteoarthritis of left hip Active Problems:   Anxiety   Post-operative nausea and vomiting   Plan: Plan for Procedure(s): LEFT TOTAL HIP ARTHROPLASTY ANTERIOR APPROACH  The patient history, physical exam, clinical judgement of the provider and imaging are consistent with end stage degenerative joint disease and total joint arthroplasty is deemed medically necessary. The treatment options including medical management, injection therapy, and arthroplasty were discussed at length. The  risks and benefits of Procedure(s): LEFT TOTAL HIP ARTHROPLASTY ANTERIOR APPROACH were presented and reviewed.  The risks of nonoperative treatment, versus surgical intervention including but not limited to continued pain, aseptic loosening, stiffness, dislocation/subluxation, infection, bleeding, nerve injury, blood clots, cardiopulmonary complications, morbidity, mortality, among others were discussed. The patient verbalizes understanding and wishes to proceed with the plan.  Patient is being admitted for inpatient treatment for surgery, pain control, PT, OT, prophylactic antibiotics, VTE prophylaxis, progressive ambulation, ADL's and discharge planning.   Dental prophylaxis discussed and recommended for 2 years postoperatively.  The patient does meet the criteria for TXA which will be used perioperatively via IV.   ASA 325 mg will be used postoperatively for DVT prophylaxis in addition to SCDs, and early ambulation. The patient is planning to be discharged home with home health services in care of her husband.  Charna Elizabeth Martensen III,PA-C 07/24/2015 4:52 PM

## 2015-07-31 ENCOUNTER — Encounter (HOSPITAL_COMMUNITY)
Admission: RE | Admit: 2015-07-31 | Discharge: 2015-07-31 | Disposition: A | Discharge status: Home / Self Care | Payer: BC Managed Care – PPO | Source: Ambulatory Visit | Attending: Orthopedic Surgery | Admitting: Orthopedic Surgery

## 2015-07-31 ENCOUNTER — Encounter (HOSPITAL_COMMUNITY): Payer: Self-pay

## 2015-07-31 ENCOUNTER — Other Ambulatory Visit (HOSPITAL_COMMUNITY): Payer: Self-pay | Admitting: *Deleted

## 2015-07-31 DIAGNOSIS — M1612 Unilateral primary osteoarthritis, left hip: Secondary | ICD-10-CM | POA: Diagnosis not present

## 2015-07-31 DIAGNOSIS — Z01810 Encounter for preprocedural cardiovascular examination: Secondary | ICD-10-CM | POA: Diagnosis not present

## 2015-07-31 DIAGNOSIS — Z01812 Encounter for preprocedural laboratory examination: Secondary | ICD-10-CM | POA: Insufficient documentation

## 2015-07-31 HISTORY — DX: Personal history of antineoplastic chemotherapy: Z92.21

## 2015-07-31 LAB — URINALYSIS, ROUTINE W REFLEX MICROSCOPIC
Bilirubin Urine: NEGATIVE
GLUCOSE, UA: NEGATIVE mg/dL
Hgb urine dipstick: NEGATIVE
Ketones, ur: NEGATIVE mg/dL
Nitrite: NEGATIVE
PH: 6 (ref 5.0–8.0)
Protein, ur: NEGATIVE mg/dL
SPECIFIC GRAVITY, URINE: 1.018 (ref 1.005–1.030)

## 2015-07-31 LAB — CBC
HCT: 36.3 % (ref 36.0–46.0)
HEMOGLOBIN: 12.1 g/dL (ref 12.0–15.0)
MCH: 29.6 pg (ref 26.0–34.0)
MCHC: 33.3 g/dL (ref 30.0–36.0)
MCV: 88.8 fL (ref 78.0–100.0)
Platelets: 381 10*3/uL (ref 150–400)
RBC: 4.09 MIL/uL (ref 3.87–5.11)
RDW: 13.2 % (ref 11.5–15.5)
WBC: 8 10*3/uL (ref 4.0–10.5)

## 2015-07-31 LAB — BASIC METABOLIC PANEL
ANION GAP: 7 (ref 5–15)
BUN: 16 mg/dL (ref 6–20)
CALCIUM: 9.5 mg/dL (ref 8.9–10.3)
CO2: 24 mmol/L (ref 22–32)
Chloride: 108 mmol/L (ref 101–111)
Creatinine, Ser: 0.74 mg/dL (ref 0.44–1.00)
GFR calc Af Amer: >60 mL/min (ref >60)
GLUCOSE: 100 mg/dL — AB (ref 65–99)
Potassium: 3.6 mmol/L (ref 3.5–5.1)
Sodium: 139 mmol/L (ref 135–145)

## 2015-07-31 LAB — URINE MICROSCOPIC-ADD ON: RBC / HPF: NONE SEEN RBC/hpf (ref 0–5)

## 2015-07-31 LAB — PROTIME-INR
INR: 1.1 (ref 0.00–1.49)
PROTHROMBIN TIME: 14.4 s (ref 11.6–15.2)

## 2015-07-31 LAB — TYPE AND SCREEN
ABO/RH(D): O POS
Antibody Screen: NEGATIVE

## 2015-07-31 LAB — SURGICAL PCR SCREEN
MRSA, PCR: NEGATIVE
Staphylococcus aureus: NEGATIVE

## 2015-07-31 LAB — APTT: APTT: 30 s (ref 24–37)

## 2015-07-31 NOTE — Pre-Procedure Instructions (Signed)
    Dana Blanchard  07/31/2015      [id number] - [place], [place] - [address] [place] [address] Phone: [phone] Fax: [phone]    Your procedure is scheduled on 08-11-2015  Tuesday    Report to [facility] Admitting at 5:30 A.M.   Call this number if you have problems the morning of surgery:  [phone]   Remember:  Do not eat food or drink liquids after midnight.   Take these medicines the morning of surgery with A SIP OF WATER bupropion(Wellbutrin),Citalopram(Celexa),Fenofibrate,               STOP ASPIRIN,ANTIINFLAMATORIES (IBUPROFEN,ALEVE,MOTRIN,ADVIL,GOODY'S POWDERS),HERBAL SUPPLEMENTS,FISH OIL,AND VITAMINS 5-7 DAYS PRIOR TO SURGERY       Do not wear jewelry, make-up or nail polish.  Do not wear lotions, powders, or perfumes.  You may NOT wear deoderant.  Do not shave 48 hours prior to surgery.     Do not bring valuables to the hospital.  [facility] is not responsible for any belongings or valuables.  Contacts, dentures or bridgework may not be worn into surgery.  Leave your suitcase in the car.  After surgery it may be brought to your room.  For patients admitted to the hospital, discharge time will be determined by your treatment team.  Patients discharged the day of surgery will not be allowed to drive home.    Special instructions:  See attached Sheet for instructions on CHG showers  Please read over the following fact sheets that you were given. MRSA Information and Surgical Site Infection Prevention, Incentive Spirometry

## 2015-08-10 MED ORDER — TRANEXAMIC ACID 1000 MG/10ML IV SOLN
1000.0000 mg | INTRAVENOUS | Status: AC
  Administered 2015-08-11: 1000 mg via INTRAVENOUS
  Filled 2015-08-10: qty 10

## 2015-08-10 MED ORDER — CEFAZOLIN SODIUM-DEXTROSE 2-4 GM/100ML-% IV SOLN
2.0000 g | INTRAVENOUS | Status: AC
  Administered 2015-08-11: 2 g via INTRAVENOUS
  Filled 2015-08-10: qty 100

## 2015-08-10 NOTE — Anesthesia Preprocedure Evaluation (Signed)
Anesthesia Evaluation  Patient identified by MRN, date of birth, ID band Patient awake    Reviewed: Allergy & Precautions, H&P , Patient's Chart, lab work & pertinent test results  Airway Mallampati: II  TM Distance: >3 FB Neck ROM: full    Dental no notable dental hx.    Pulmonary    Pulmonary exam normal breath sounds clear to auscultation       Cardiovascular Exercise Tolerance: Good IV Rhythm:regular Rate:Normal     Neuro/Psych    GI/Hepatic   Endo/Other    Renal/GU      Musculoskeletal   Abdominal   Peds  Hematology   Anesthesia Other Findings   Reproductive/Obstetrics                            Anesthesia Physical Anesthesia Plan  ASA: II  Anesthesia Plan: Spinal   Post-op Pain Management:    Induction:   Airway Management Planned:   Additional Equipment:   Intra-op Plan:   Post-operative Plan:   Informed Consent: I have reviewed the patients History and Physical, chart, labs and discussed the procedure including the risks, benefits and alternatives for the proposed anesthesia with the patient or authorized representative who has indicated his/her understanding and acceptance.   Dental Advisory Given  Plan Discussed with: CRNA  Anesthesia Plan Comments: (Lab work confirmed....... Platelets okay. Discussed spinal anesthetic, and patient consents to the procedure:  included risk of possible headache,backache, failed block, allergic reaction, and nerve injury. This patient was asked if she had any questions or concerns before the procedure started. )        Anesthesia Quick Evaluation

## 2015-08-11 ENCOUNTER — Inpatient Hospital Stay (HOSPITAL_COMMUNITY): Payer: BC Managed Care – PPO

## 2015-08-11 ENCOUNTER — Encounter (HOSPITAL_COMMUNITY): Payer: Self-pay | Admitting: Surgery

## 2015-08-11 ENCOUNTER — Encounter (HOSPITAL_COMMUNITY)
Admission: RE | Disposition: A | Discharge status: Home Health | Payer: Self-pay | Source: Ambulatory Visit | Attending: Orthopedic Surgery

## 2015-08-11 ENCOUNTER — Inpatient Hospital Stay (HOSPITAL_COMMUNITY): Payer: BC Managed Care – PPO | Admitting: Certified Registered Nurse Anesthetist

## 2015-08-11 ENCOUNTER — Inpatient Hospital Stay (HOSPITAL_COMMUNITY)
Admission: RE | Admit: 2015-08-11 | Discharge: 2015-08-12 | DRG: 470 | Disposition: A | Discharge status: Home Health | Payer: BC Managed Care – PPO | Source: Ambulatory Visit | Attending: Orthopedic Surgery | Admitting: Orthopedic Surgery

## 2015-08-11 DIAGNOSIS — Z96651 Presence of right artificial knee joint: Secondary | ICD-10-CM | POA: Diagnosis present

## 2015-08-11 DIAGNOSIS — Z9221 Personal history of antineoplastic chemotherapy: Secondary | ICD-10-CM

## 2015-08-11 DIAGNOSIS — Z419 Encounter for procedure for purposes other than remedying health state, unspecified: Secondary | ICD-10-CM

## 2015-08-11 DIAGNOSIS — Z79899 Other long term (current) drug therapy: Secondary | ICD-10-CM | POA: Diagnosis not present

## 2015-08-11 DIAGNOSIS — D62 Acute posthemorrhagic anemia: Secondary | ICD-10-CM | POA: Diagnosis not present

## 2015-08-11 DIAGNOSIS — F419 Anxiety disorder, unspecified: Secondary | ICD-10-CM | POA: Diagnosis present

## 2015-08-11 DIAGNOSIS — Z853 Personal history of malignant neoplasm of breast: Secondary | ICD-10-CM

## 2015-08-11 DIAGNOSIS — M1612 Unilateral primary osteoarthritis, left hip: Principal | ICD-10-CM | POA: Diagnosis present

## 2015-08-11 DIAGNOSIS — R112 Nausea with vomiting, unspecified: Secondary | ICD-10-CM | POA: Diagnosis present

## 2015-08-11 DIAGNOSIS — Z9889 Other specified postprocedural states: Secondary | ICD-10-CM | POA: Diagnosis present

## 2015-08-11 HISTORY — PX: TOTAL HIP ARTHROPLASTY: SHX124

## 2015-08-11 SURGERY — ARTHROPLASTY, HIP, TOTAL, ANTERIOR APPROACH
Anesthesia: Monitor Anesthesia Care | Site: Hip | Laterality: Left

## 2015-08-11 MED ORDER — MENTHOL 3 MG MT LOZG: 1.0000 | LOZENGE | OROMUCOSAL | Status: DC | PRN

## 2015-08-11 MED ORDER — PROPOFOL 10 MG/ML IV BOLUS
INTRAVENOUS | Status: DC | PRN
  Administered 2015-08-11: 20 mg via INTRAVENOUS

## 2015-08-11 MED ORDER — METHOCARBAMOL 500 MG PO TABS
500.0000 mg | ORAL_TABLET | Freq: Four times a day (QID) | ORAL | Status: DC | PRN
  Administered 2015-08-11 – 2015-08-12 (×3): 500 mg via ORAL
  Filled 2015-08-11 (×4): qty 1

## 2015-08-11 MED ORDER — METOCLOPRAMIDE HCL 5 MG/ML IJ SOLN: 5.0000 mg | Freq: Three times a day (TID) | INTRAMUSCULAR | Status: DC | PRN

## 2015-08-11 MED ORDER — DEXAMETHASONE SODIUM PHOSPHATE 10 MG/ML IJ SOLN
10.0000 mg | Freq: Once | INTRAMUSCULAR | Status: AC
  Administered 2015-08-12: 10 mg via INTRAVENOUS
  Filled 2015-08-11: qty 1

## 2015-08-11 MED ORDER — OXYCODONE HCL 5 MG PO TABS
5.0000 mg | ORAL_TABLET | ORAL | Status: DC | PRN
  Administered 2015-08-11 – 2015-08-12 (×6): 10 mg via ORAL
  Filled 2015-08-11 (×6): qty 2

## 2015-08-11 MED ORDER — HYDROMORPHONE HCL 1 MG/ML IJ SOLN
0.2500 mg | INTRAMUSCULAR | Status: DC | PRN
  Administered 2015-08-11 (×4): 0.5 mg via INTRAVENOUS

## 2015-08-11 MED ORDER — 0.9 % SODIUM CHLORIDE (POUR BTL) OPTIME
TOPICAL | Status: DC | PRN
  Administered 2015-08-11: 1000 mL

## 2015-08-11 MED ORDER — KETOROLAC TROMETHAMINE 30 MG/ML IJ SOLN
INTRAMUSCULAR | Status: DC | PRN
  Administered 2015-08-11: 30 mg

## 2015-08-11 MED ORDER — ONDANSETRON HCL 4 MG/2ML IJ SOLN
INTRAMUSCULAR | Status: DC | PRN
  Administered 2015-08-11: 4 mg via INTRAVENOUS

## 2015-08-11 MED ORDER — OMEPRAZOLE 20 MG PO CPDR: 20.0000 mg | DELAYED_RELEASE_CAPSULE | Freq: Every day | ORAL | 0 refills | Status: AC

## 2015-08-11 MED ORDER — MIDAZOLAM HCL 5 MG/5ML IJ SOLN
INTRAMUSCULAR | Status: DC | PRN
  Administered 2015-08-11: 2 mg via INTRAVENOUS

## 2015-08-11 MED ORDER — VITAMIN B-12 1000 MCG PO TABS
1000.0000 ug | ORAL_TABLET | Freq: Every day | ORAL | Status: DC
  Administered 2015-08-11 – 2015-08-12 (×2): 1000 ug via ORAL
  Filled 2015-08-11 (×2): qty 1

## 2015-08-11 MED ORDER — ASPIRIN EC 325 MG PO TBEC
325.0000 mg | DELAYED_RELEASE_TABLET | Freq: Every day | ORAL | Status: DC
  Administered 2015-08-12: 325 mg via ORAL
  Filled 2015-08-11: qty 1

## 2015-08-11 MED ORDER — ONDANSETRON HCL 4 MG PO TABS
4.0000 mg | ORAL_TABLET | Freq: Four times a day (QID) | ORAL | Status: DC | PRN
  Filled 2015-08-11: qty 1

## 2015-08-11 MED ORDER — METHOCARBAMOL 500 MG PO TABS
ORAL_TABLET | ORAL | Status: AC
  Filled 2015-08-11: qty 1

## 2015-08-11 MED ORDER — ONDANSETRON HCL 4 MG/2ML IJ SOLN
4.0000 mg | Freq: Four times a day (QID) | INTRAMUSCULAR | Status: DC | PRN
  Administered 2015-08-11 (×2): 4 mg via INTRAVENOUS
  Filled 2015-08-11 (×2): qty 2

## 2015-08-11 MED ORDER — BUPIVACAINE LIPOSOME 1.3 % IJ SUSP
20.0000 mL | INTRAMUSCULAR | Status: DC
  Filled 2015-08-11: qty 20

## 2015-08-11 MED ORDER — METOCLOPRAMIDE HCL 5 MG PO TABS: 5.0000 mg | ORAL_TABLET | Freq: Three times a day (TID) | ORAL | Status: DC | PRN

## 2015-08-11 MED ORDER — FENOFIBRATE 160 MG PO TABS
160.0000 mg | ORAL_TABLET | Freq: Every day | ORAL | Status: DC
  Administered 2015-08-12: 160 mg via ORAL
  Filled 2015-08-11: qty 1

## 2015-08-11 MED ORDER — BUPIVACAINE-EPINEPHRINE 0.25% -1:200000 IJ SOLN
INTRAMUSCULAR | Status: DC | PRN
  Administered 2015-08-11: 30 mL

## 2015-08-11 MED ORDER — LACTATED RINGERS IV SOLN
INTRAVENOUS | Status: DC | PRN
  Administered 2015-08-11 (×2): via INTRAVENOUS

## 2015-08-11 MED ORDER — METHOCARBAMOL 1000 MG/10ML IJ SOLN
500.0000 mg | Freq: Four times a day (QID) | INTRAVENOUS | Status: DC | PRN
  Filled 2015-08-11: qty 5

## 2015-08-11 MED ORDER — DEXTROSE-NACL 5-0.45 % IV SOLN
INTRAVENOUS | Status: DC
  Administered 2015-08-11: 15:00:00 via INTRAVENOUS

## 2015-08-11 MED ORDER — LIDOCAINE HCL (CARDIAC) 20 MG/ML IV SOLN
INTRAVENOUS | Status: DC | PRN
  Administered 2015-08-11: 50 mg via INTRAVENOUS

## 2015-08-11 MED ORDER — PHENYLEPHRINE 40 MCG/ML (10ML) SYRINGE FOR IV PUSH (FOR BLOOD PRESSURE SUPPORT)
PREFILLED_SYRINGE | INTRAVENOUS | Status: AC
  Filled 2015-08-11: qty 10

## 2015-08-11 MED ORDER — CHLORHEXIDINE GLUCONATE 4 % EX LIQD: 60.0000 mL | Freq: Once | CUTANEOUS | Status: DC

## 2015-08-11 MED ORDER — LACTATED RINGERS IV SOLN: INTRAVENOUS | Status: DC

## 2015-08-11 MED ORDER — PROPOFOL 500 MG/50ML IV EMUL
INTRAVENOUS | Status: DC | PRN
  Administered 2015-08-11: 75 ug/kg/min via INTRAVENOUS

## 2015-08-11 MED ORDER — LIDOCAINE 2% (20 MG/ML) 5 ML SYRINGE
INTRAMUSCULAR | Status: AC
  Filled 2015-08-11: qty 5

## 2015-08-11 MED ORDER — MORPHINE SULFATE (PF) 2 MG/ML IV SOLN
2.0000 mg | INTRAVENOUS | Status: DC | PRN
  Administered 2015-08-11: 2 mg via INTRAVENOUS
  Filled 2015-08-11: qty 1

## 2015-08-11 MED ORDER — DIPHENHYDRAMINE HCL 12.5 MG/5ML PO ELIX
12.5000 mg | ORAL_SOLUTION | ORAL | Status: DC | PRN
  Administered 2015-08-11 (×2): 25 mg via ORAL
  Filled 2015-08-11 (×2): qty 10

## 2015-08-11 MED ORDER — SODIUM CHLORIDE 0.9 % IJ SOLN: INTRAMUSCULAR | Status: DC | PRN

## 2015-08-11 MED ORDER — ACETAMINOPHEN 650 MG RE SUPP: 650.0000 mg | Freq: Four times a day (QID) | RECTAL | Status: DC | PRN

## 2015-08-11 MED ORDER — DOCUSATE SODIUM 100 MG PO CAPS
100.0000 mg | ORAL_CAPSULE | Freq: Two times a day (BID) | ORAL | Status: DC
  Administered 2015-08-11 – 2015-08-12 (×3): 100 mg via ORAL
  Filled 2015-08-11 (×3): qty 1

## 2015-08-11 MED ORDER — CELECOXIB 200 MG PO CAPS
200.0000 mg | ORAL_CAPSULE | Freq: Two times a day (BID) | ORAL | Status: DC
  Administered 2015-08-11 – 2015-08-12 (×3): 200 mg via ORAL
  Filled 2015-08-11 (×3): qty 1

## 2015-08-11 MED ORDER — OXYCODONE-ACETAMINOPHEN 5-325 MG PO TABS: 1.0000 | ORAL_TABLET | ORAL | 0 refills | Status: AC | PRN

## 2015-08-11 MED ORDER — PROPOFOL 10 MG/ML IV BOLUS
INTRAVENOUS | Status: AC
Start: 2015-08-11 — End: 2015-08-11
  Filled 2015-08-11: qty 20

## 2015-08-11 MED ORDER — ACETAMINOPHEN 325 MG PO TABS
650.0000 mg | ORAL_TABLET | Freq: Four times a day (QID) | ORAL | Status: DC | PRN
  Administered 2015-08-12: 650 mg via ORAL
  Filled 2015-08-11: qty 2

## 2015-08-11 MED ORDER — FENTANYL CITRATE (PF) 100 MCG/2ML IJ SOLN
INTRAMUSCULAR | Status: DC | PRN
  Administered 2015-08-11: 100 ug via INTRAVENOUS

## 2015-08-11 MED ORDER — METHOCARBAMOL 500 MG PO TABS: 500.0000 mg | ORAL_TABLET | Freq: Four times a day (QID) | ORAL | 0 refills | Status: AC | PRN

## 2015-08-11 MED ORDER — PHENOL 1.4 % MT LIQD: 1.0000 | OROMUCOSAL | Status: DC | PRN

## 2015-08-11 MED ORDER — TRANEXAMIC ACID 1000 MG/10ML IV SOLN
2000.0000 mg | INTRAVENOUS | Status: DC
  Filled 2015-08-11: qty 20

## 2015-08-11 MED ORDER — CEFAZOLIN SODIUM-DEXTROSE 2-4 GM/100ML-% IV SOLN
2.0000 g | Freq: Four times a day (QID) | INTRAVENOUS | Status: AC
  Administered 2015-08-11 (×2): 2 g via INTRAVENOUS
  Filled 2015-08-11 (×2): qty 100

## 2015-08-11 MED ORDER — SODIUM CHLORIDE FLUSH 0.9 % IV SOLN
INTRAVENOUS | Status: DC | PRN
  Administered 2015-08-11 (×2): 10 mL

## 2015-08-11 MED ORDER — BUPIVACAINE-EPINEPHRINE (PF) 0.25% -1:200000 IJ SOLN
INTRAMUSCULAR | Status: AC
  Filled 2015-08-11: qty 30

## 2015-08-11 MED ORDER — PHENYLEPHRINE HCL 10 MG/ML IJ SOLN
INTRAMUSCULAR | Status: DC | PRN
  Administered 2015-08-11 (×2): 80 ug via INTRAVENOUS

## 2015-08-11 MED ORDER — CITALOPRAM HYDROBROMIDE 10 MG PO TABS
10.0000 mg | ORAL_TABLET | Freq: Every day | ORAL | Status: DC
  Administered 2015-08-12: 10 mg via ORAL
  Filled 2015-08-11: qty 1

## 2015-08-11 MED ORDER — MIDAZOLAM HCL 2 MG/2ML IJ SOLN
INTRAMUSCULAR | Status: AC
  Filled 2015-08-11: qty 2

## 2015-08-11 MED ORDER — HYDROMORPHONE HCL 1 MG/ML IJ SOLN
INTRAMUSCULAR | Status: AC
  Filled 2015-08-11: qty 1

## 2015-08-11 MED ORDER — ASPIRIN EC 325 MG PO TBEC: 325.0000 mg | DELAYED_RELEASE_TABLET | Freq: Every day | ORAL | 0 refills | Status: AC

## 2015-08-11 MED ORDER — SACCHAROMYCES BOULARDII 250 MG PO CAPS
250.0000 mg | ORAL_CAPSULE | Freq: Every day | ORAL | Status: DC
  Administered 2015-08-11 – 2015-08-12 (×2): 250 mg via ORAL
  Filled 2015-08-11 (×2): qty 1

## 2015-08-11 MED ORDER — ACETAMINOPHEN 500 MG PO TABS
ORAL_TABLET | ORAL | Status: AC
  Administered 2015-08-11: 1000 mg via ORAL
  Filled 2015-08-11: qty 2

## 2015-08-11 MED ORDER — FENTANYL CITRATE (PF) 250 MCG/5ML IJ SOLN
INTRAMUSCULAR | Status: AC
  Filled 2015-08-11: qty 5

## 2015-08-11 MED ORDER — FLEET ENEMA 7-19 GM/118ML RE ENEM: 1.0000 | ENEMA | Freq: Once | RECTAL | Status: DC | PRN

## 2015-08-11 MED ORDER — ROCURONIUM BROMIDE 50 MG/5ML IV SOLN
INTRAVENOUS | Status: AC
  Filled 2015-08-11: qty 1

## 2015-08-11 MED ORDER — ONDANSETRON HCL 4 MG/2ML IJ SOLN
INTRAMUSCULAR | Status: AC
  Filled 2015-08-11: qty 2

## 2015-08-11 MED ORDER — KETOROLAC TROMETHAMINE 15 MG/ML IJ SOLN
15.0000 mg | Freq: Four times a day (QID) | INTRAMUSCULAR | Status: AC
  Administered 2015-08-11 – 2015-08-12 (×4): 15 mg via INTRAVENOUS
  Filled 2015-08-11 (×4): qty 1

## 2015-08-11 MED ORDER — ACETAMINOPHEN 500 MG PO TABS
1000.0000 mg | ORAL_TABLET | Freq: Once | ORAL | Status: AC
  Administered 2015-08-11: 1000 mg via ORAL

## 2015-08-11 MED ORDER — SUMATRIPTAN SUCCINATE 50 MG PO TABS
50.0000 mg | ORAL_TABLET | ORAL | Status: DC | PRN
  Filled 2015-08-11: qty 1

## 2015-08-11 MED ORDER — DOCUSATE SODIUM 100 MG PO CAPS: 100.0000 mg | ORAL_CAPSULE | Freq: Two times a day (BID) | ORAL | 0 refills | Status: AC

## 2015-08-11 MED ORDER — BISACODYL 10 MG RE SUPP: 10.0000 mg | Freq: Every day | RECTAL | Status: DC | PRN

## 2015-08-11 MED ORDER — ONDANSETRON HCL 4 MG PO TABS: 4.0000 mg | ORAL_TABLET | Freq: Three times a day (TID) | ORAL | 0 refills | Status: AC | PRN

## 2015-08-11 MED ORDER — BUPROPION HCL ER (XL) 150 MG PO TB24
150.0000 mg | ORAL_TABLET | Freq: Every day | ORAL | Status: DC
  Administered 2015-08-12: 150 mg via ORAL
  Filled 2015-08-11: qty 1

## 2015-08-11 SURGICAL SUPPLY — 66 items
BAG DECANTER FOR FLEXI CONT (MISCELLANEOUS) IMPLANT
BLADE SAG 18X100X1.27 (BLADE) ×3 IMPLANT
BLADE SAW SGTL 18X1.27X75 (BLADE) IMPLANT
BLADE SAW SGTL 18X1.27X75MM (BLADE)
BLADE SURG ROTATE 9660 (MISCELLANEOUS) IMPLANT
BNDG COHESIVE 6X5 TAN STRL LF (GAUZE/BANDAGES/DRESSINGS) ×3 IMPLANT
CAPT HIP TOTAL 2 ×3 IMPLANT
CLOSURE STERI-STRIP 1/2X4 (GAUZE/BANDAGES/DRESSINGS) ×2
CLSR STERI-STRIP ANTIMIC 1/2X4 (GAUZE/BANDAGES/DRESSINGS) ×4 IMPLANT
COVER PERINEAL POST (MISCELLANEOUS) ×3 IMPLANT
COVER SURGICAL LIGHT HANDLE (MISCELLANEOUS) ×3 IMPLANT
DRAPE C-ARM 42X72 X-RAY (DRAPES) ×3 IMPLANT
DRAPE STERI IOBAN 125X83 (DRAPES) ×3 IMPLANT
DRAPE U-SHAPE 47X51 STRL (DRAPES) ×6 IMPLANT
DRSG MEPILEX BORDER 4X8 (GAUZE/BANDAGES/DRESSINGS) ×6 IMPLANT
DURAPREP 26ML APPLICATOR (WOUND CARE) ×3 IMPLANT
ELECT BLADE 4.0 EZ CLEAN MEGAD (MISCELLANEOUS) ×3
ELECT REM PT RETURN 9FT ADLT (ELECTROSURGICAL) ×3
ELECTRODE BLDE 4.0 EZ CLN MEGD (MISCELLANEOUS) ×1 IMPLANT
ELECTRODE REM PT RTRN 9FT ADLT (ELECTROSURGICAL) ×1 IMPLANT
FACESHIELD WRAPAROUND (MASK) ×6 IMPLANT
GLOVE BIO SURGEON STRL SZ7 (GLOVE) IMPLANT
GLOVE BIO SURGEON STRL SZ7.5 (GLOVE) ×3 IMPLANT
GLOVE BIOGEL PI IND STRL 6 (GLOVE) ×1 IMPLANT
GLOVE BIOGEL PI IND STRL 6.5 (GLOVE) ×1 IMPLANT
GLOVE BIOGEL PI IND STRL 7.0 (GLOVE) IMPLANT
GLOVE BIOGEL PI IND STRL 8 (GLOVE) ×3 IMPLANT
GLOVE BIOGEL PI INDICATOR 6 (GLOVE) ×2
GLOVE BIOGEL PI INDICATOR 6.5 (GLOVE) ×2
GLOVE BIOGEL PI INDICATOR 7.0 (GLOVE)
GLOVE BIOGEL PI INDICATOR 8 (GLOVE) ×6
GLOVE SURG SS PI 6.0 STRL IVOR (GLOVE) ×3 IMPLANT
GLOVE SURG SS PI 6.5 STRL IVOR (GLOVE) ×3 IMPLANT
GLOVE SURG SS PI 7.0 STRL IVOR (GLOVE) ×3 IMPLANT
GLOVE SURG SS PI 7.5 STRL IVOR (GLOVE) ×6 IMPLANT
GLOVE SURG SS PI 8.0 STRL IVOR (GLOVE) ×3 IMPLANT
GOWN STRL REUS W/ TWL LRG LVL3 (GOWN DISPOSABLE) ×4 IMPLANT
GOWN STRL REUS W/ TWL XL LVL3 (GOWN DISPOSABLE) ×1 IMPLANT
GOWN STRL REUS W/TWL LRG LVL3 (GOWN DISPOSABLE) ×8
GOWN STRL REUS W/TWL XL LVL3 (GOWN DISPOSABLE) ×2
KIT BASIN OR (CUSTOM PROCEDURE TRAY) ×3 IMPLANT
KIT ROOM TURNOVER OR (KITS) ×3 IMPLANT
MANIFOLD NEPTUNE II (INSTRUMENTS) ×3 IMPLANT
NDL SAFETY ECLIPSE 18X1.5 (NEEDLE) IMPLANT
NEEDLE 18GX1X1/2 (RX/OR ONLY) (NEEDLE) ×3 IMPLANT
NEEDLE HYPO 18GX1.5 SHARP (NEEDLE)
NS IRRIG 1000ML POUR BTL (IV SOLUTION) ×3 IMPLANT
PACK TOTAL JOINT (CUSTOM PROCEDURE TRAY) ×3 IMPLANT
PACK UNIVERSAL I (CUSTOM PROCEDURE TRAY) IMPLANT
PAD ARMBOARD 7.5X6 YLW CONV (MISCELLANEOUS) ×3 IMPLANT
SLEEVE SURGEON STRL (DRAPES) ×3 IMPLANT
SPONGE LAP 18X18 X RAY DECT (DISPOSABLE) IMPLANT
SUT MNCRL AB 4-0 PS2 18 (SUTURE) ×3 IMPLANT
SUT MON AB 2-0 CT1 36 (SUTURE) ×3 IMPLANT
SUT VIC AB 0 CT1 27 (SUTURE) ×2
SUT VIC AB 0 CT1 27XBRD ANBCTR (SUTURE) ×1 IMPLANT
SUT VIC AB 1 CT1 27 (SUTURE) ×2
SUT VIC AB 1 CT1 27XBRD ANBCTR (SUTURE) ×1 IMPLANT
SYR 50ML LL SCALE MARK (SYRINGE) IMPLANT
SYRINGE 20CC LL (MISCELLANEOUS) IMPLANT
TOWEL OR 17X24 6PK STRL BLUE (TOWEL DISPOSABLE) ×3 IMPLANT
TOWEL OR 17X26 10 PK STRL BLUE (TOWEL DISPOSABLE) ×3 IMPLANT
TRAY CATH 16FR W/PLASTIC CATH (SET/KITS/TRAYS/PACK) ×3 IMPLANT
TRAY FOLEY CATH 16FRSI W/METER (SET/KITS/TRAYS/PACK) IMPLANT
WATER STERILE IRR 1000ML POUR (IV SOLUTION) IMPLANT
YANKAUER SUCT BULB TIP NO VENT (SUCTIONS) ×6 IMPLANT

## 2015-08-11 NOTE — Discharge Instructions (Signed)

## 2015-08-11 NOTE — Anesthesia Procedure Notes (Signed)
Spinal  Patient location during procedure: OR Preanesthetic Checklist Completed: patient identified, site marked, surgical consent, pre-op evaluation, timeout performed, IV checked, risks and benefits discussed and monitors and equipment checked Spinal Block Patient position: sitting Prep: DuraPrep Patient monitoring: heart rate, cardiac monitor, continuous pulse ox and blood pressure Approach: midline Location: L3-4 Injection technique: single-shot Needle Needle type: Sprotte  Needle gauge: 24 G Needle length: 9 cm Assessment Sensory level: T4 Additional Notes Spinal Dosage in OR RLD x 2 min  Bupivicaine ml       1.9

## 2015-08-11 NOTE — Evaluation (Signed)
Physical Therapy Evaluation Patient Details Name: Dana Blanchard MRN: [id number] DOB: 12-20-1955 Today's Date: 08/11/2015   History of Present Illness  Pt is a 60 y/o female s/p L THA (anterior approach). PMH including but not limited to R TKA (2016), R knee arthroscopy with meniscectomy (2015), Breast Cancer and L mastectomy.  Clinical Impression  Pt presented supine in bed with HOB elevated, awake and willing to participate in therapy session. Pt with fatigue and pain that limited her ambulation distance and further participation during session. Pt would continue to benefit from skilled physical therapy services at this time while admitted and after d/c to address her below listed limitations in order to improve her overall safety and independence with functional mobility.      Follow Up Recommendations Home health PT;Supervision for mobility/OOB    Equipment Recommendations  None recommended by PT;Other (comment) (pt stated that she has all necessary equipment at home)    Recommendations for Other Services       Precautions / Restrictions Precautions Precautions: Fall Restrictions Weight Bearing Restrictions: Yes LLE Weight Bearing: Weight bearing as tolerated      Mobility  Bed Mobility Overal bed mobility: Needs Assistance Bed Mobility: Supine to Sit     Supine to sit: Min assist     General bed mobility comments: Pt required increased time  Transfers Overall transfer level: Needs assistance Equipment used: Rolling walker (2 wheeled) Transfers: Sit to/from Stand Sit to Stand: Min guard         General transfer comment: Pt required increased time and VC'ing for technique  Ambulation/Gait Ambulation/Gait assistance: Min guard Ambulation Distance (Feet): 5 Feet Assistive device: Rolling walker (2 wheeled) Gait Pattern/deviations: Step-to pattern;Decreased step length - right;Decreased stance time - left;Decreased weight shift to left Gait velocity:  decreased Gait velocity interpretation: Below normal speed for age/gender    Stairs            Wheelchair Mobility    Modified Rankin (Stroke Patients Only)       Balance Overall balance assessment: Needs assistance Sitting-balance support: Bilateral upper extremity supported;Feet supported Sitting balance-Leahy Scale: Poor     Standing balance support: Bilateral upper extremity supported;During functional activity Standing balance-Leahy Scale: Poor                               Pertinent Vitals/Pain Pain Assessment: 0-10 Pain Score: 4  Pain Location: L hip Pain Descriptors / Indicators: Discomfort;Operative site guarding Pain Intervention(s): Limited activity within patient's tolerance;Monitored during session;Repositioned    Home Living Family/patient expects to be discharged to:: Private residence Living Arrangements: Spouse/significant other Available Help at Discharge: Family;Available 24 hours/day Type of Home: House Home Access: Stairs to enter Entrance Stairs-Rails: None Entrance Stairs-Number of Steps: 2 Home Layout: Two level;Able to live on main level with bedroom/bathroom Home Equipment: Walker - 2 wheels      Prior Function Level of Independence: Independent               Hand Dominance        Extremity/Trunk Assessment   Upper Extremity Assessment: Overall WFL for tasks assessed           Lower Extremity Assessment: LLE deficits/detail   LLE Deficits / Details: Pt with decreased strength and ROM limitations secondary to post-op. Sensation grossly intact.     Communication   Communication: No difficulties  Cognition Arousal/Alertness: Lethargic Behavior During Therapy: WFL for tasks assessed/performed Overall Cognitive  Status: Within Functional Limits for tasks assessed                      General Comments      Exercises Total Joint Exercises Hip ABduction/ADduction: AROM;Strengthening;Left;10  reps Long Arc Quad: AROM;Strengthening;Left;10 reps Marching in Standing: AROM;Strengthening;Both;10 reps;Seated      Assessment/Plan    PT Assessment Patient needs continued PT services  PT Diagnosis Difficulty walking   PT Problem List Decreased strength;Decreased range of motion;Decreased mobility;Decreased balance;Decreased activity tolerance;Decreased coordination;Decreased knowledge of use of DME;Pain  PT Treatment Interventions DME instruction;Gait training;Stair training;Functional mobility training;Therapeutic activities;Therapeutic exercise;Balance training;Neuromuscular re-education;Patient/family education   PT Goals (Current goals can be found in the Care Plan section) Acute Rehab PT Goals Patient Stated Goal: return home PT Goal Formulation: With patient Time For Goal Achievement: 08/18/15 Potential to Achieve Goals: Good    Frequency 7X/week   Barriers to discharge        Co-evaluation               End of Session Equipment Utilized During Treatment: Gait belt Activity Tolerance: Patient limited by fatigue;Patient limited by pain Patient left: in chair;with call bell/phone within reach Nurse Communication: Mobility status         Time: [id number] PT Time Calculation (min) (ACUTE ONLY): 21 min   Charges:   PT Evaluation $PT Eval Moderate Complexity: 1 Procedure     PT G CodesClearnce Sorrel Kayln Garceau 08/11/2015, 4:32 PM Sherie Don, [place], DPT [phone]

## 2015-08-11 NOTE — Interval H&P Note (Signed)
History and Physical Interval Note:  08/11/2015 6:48 AM  Dana Blanchard  has presented today for surgery, with the diagnosis of OA LEFT HIP  The various methods of treatment have been discussed with the patient and family. After consideration of risks, benefits and other options for treatment, the patient has consented to  Procedure(s): LEFT TOTAL HIP ARTHROPLASTY ANTERIOR APPROACH (Left) as a surgical intervention .  The patient's history has been reviewed, patient examined, no change in status, stable for surgery.  I have reviewed the patient's chart and labs.  Questions were answered to the patient's satisfaction.     MURPHY, TIMOTHY D

## 2015-08-11 NOTE — Op Note (Signed)
08/11/2015  9:01 AM  PATIENT:  Dana Blanchard   MRN: [id number]  PRE-OPERATIVE DIAGNOSIS:  OA LEFT HIP  POST-OPERATIVE DIAGNOSIS:  OA LEFT HIP  PROCEDURE:  Procedure(s): LEFT TOTAL HIP ARTHROPLASTY ANTERIOR APPROACH  PREOPERATIVE INDICATIONS:    Dana Blanchard is an 60 y.o. female who has a diagnosis of Primary osteoarthritis of left hip and elected for surgical management after failing conservative treatment.  The risks benefits and alternatives were discussed with the patient including but not limited to the risks of nonoperative treatment, versus surgical intervention including infection, bleeding, nerve injury, periprosthetic fracture, the need for revision surgery, dislocation, leg length discrepancy, blood clots, cardiopulmonary complications, morbidity, mortality, among others, and they were willing to proceed.     OPERATIVE REPORT     SURGEON:   Mikell Camp, Ernesta Amble, MD    ASSISTANT:  Roxan Hockey, PA-C, he was present and scrubbed throughout the case, critical for completion in a timely fashion, and for retraction, instrumentation, and closure.     ANESTHESIA:  General    COMPLICATIONS:  None.     COMPONENTS:  St[employer]ker acolade fit femur size 4 with a 36 mm -2.5 head ball and a PSL acetabular shell size 52 with a  polyethylene liner    PROCEDURE IN DETAIL:   The patient was met in the holding area and  identified.  The appropriate hip was identified and marked at the operative site.  The patient was then transported to the OR  and  placed under general anesthesia.  At that point, the patient was  placed in the supine position and  secured to the operating room table and all bony prominences padded. He received pre-operative antibiotics    The operative lower extremity was prepped from the iliac crest to the distal leg.  Sterile draping was performed.  Time out was performed prior to incision.      Skin incision was made just 2 cm lateral to the ASIS  extending in line with  the tensor fascia lata. Electrocautery was used to control all bleeders. I dissected down sharply to the fascia of the tensor fascia lata was confirmed that the muscle fibers beneath were running posteriorly. I then incised the fascia over the superficial tensor fascia lata in line with the incision. The fascia was elevated off the anterior aspect of the muscle the muscle was retracted posteriorly and protected throughout the case. I then used electrocautery to incise the tensor fascia lata fascia control and all bleeders. Immediately visible was the fat over top of the anterior neck and capsule.  I removed the anterior fat from the capsule and elevated the rectus muscle off of the anterior capsule. I then removed a large time of capsule. The retractors were then placed over the anterior acetabulum as well as around the superior and inferior neck.  I then removed a section of the femoral neck and a napkin ring fashion. Then used the power course to remove the femoral head from the acetabulum and thoroughly irrigated the acetabulum. I sized the femoral head.    I then exposed the deep acetabulum, cleared out any tissue including the ligamentum teres.   After adequate visualization, I excised the labrum, and then sequentially reamed.  I placed the trial acetabulum, which seated nicely, and then impacted the real cup into place.  Appropriate version and inclination was confirmed clinically matching their bony anatomy, and also with the use transverse acetabular ligament.  I placed a 57m screw in the posterior/superio position  with an excellent bite.    I then placed the polyethylene liner in place  I then abducted the leg and released the external rotators from the posterior femur allowing it to be easily delivered up lateral and anterior to the acetabulum for preparation of the femoral canal.    I then prepared the proximal femur using the cookie-cutter and then sequentially reamed and broached.  A  trial broach, neck, and head was utilized, and I reduced the hip and it was found to have excellent stability with functional range of motion..  I then impacted the real femoral prosthesis into place into the appropriate version, slightly anteverted to the normal anatomy, and I impacted the real head ball into place. The hip was then reduced and taken through functional range of motion and found to have excellent stability. Leg lengths were restored.  I then irrigated the hip copiously again with, and repaired the fascia with Vicryl, followed by monocryl for the subcutaneous tissue, Monocryl for the skin, Steri-Strips and sterile gauze. The patient was then awakened and returned to PACU in stable and satisfactory condition. There were no complications.  POST OPERATIVE PLAN: WBAT, DVT px: SCD's/TED and ASA 325  Edmonia Lynch, MD Orthopedic Surgeon [phone]   This note was generated using a template and dragon dictation system. In light of that, I have reviewed the note and all aspects of it are applicable to this case. Any dictation errors are due to the computerized dictation system.

## 2015-08-11 NOTE — Transfer of Care (Signed)
Immediate Anesthesia Transfer of Care Note  Patient: Dana Blanchard  Procedure(s) Performed: Procedure(s): LEFT TOTAL HIP ARTHROPLASTY ANTERIOR APPROACH (Left)  Patient Location: PACU  Anesthesia Type:MAC and Spinal  Level of Consciousness: awake, alert , oriented and patient cooperative  Airway & Oxygen Therapy: Patient Spontanous Breathing and Patient connected to nasal cannula oxygen  Post-op Assessment: Report given to RN and Post -op Vital signs reviewed and stable  Post vital signs: Reviewed and stable  Last Vitals:  Vitals:   08/11/15 0610 08/11/15 0949  BP: 129/73 124/80  Pulse: 95 93  Resp: 20 13  Temp: 36.9 C 36.2 C    Last Pain:  Vitals:   08/11/15 0949  TempSrc:   PainSc: (P) 0-No pain      Patients Stated Pain Goal: 1 ([id number])  Complications: No apparent anesthesia complications

## 2015-08-12 ENCOUNTER — Encounter (HOSPITAL_COMMUNITY): Payer: Self-pay | Admitting: Orthopedic Surgery

## 2015-08-12 LAB — BASIC METABOLIC PANEL
ANION GAP: 7 (ref 5–15)
BUN: 13 mg/dL (ref 6–20)
CALCIUM: 9.5 mg/dL (ref 8.9–10.3)
CO2: 26 mmol/L (ref 22–32)
Chloride: 106 mmol/L (ref 101–111)
Creatinine, Ser: 0.82 mg/dL (ref 0.44–1.00)
GFR calc Af Amer: >60 mL/min (ref >60)
Glucose, Bld: 104 mg/dL — ABNORMAL HIGH (ref 65–99)
POTASSIUM: 4.4 mmol/L (ref 3.5–5.1)
SODIUM: 139 mmol/L (ref 135–145)

## 2015-08-12 LAB — CBC
HCT: 30.9 % — ABNORMAL LOW (ref 36.0–46.0)
Hemoglobin: 10 g/dL — ABNORMAL LOW (ref 12.0–15.0)
MCH: 29.1 pg (ref 26.0–34.0)
MCHC: 32.4 g/dL (ref 30.0–36.0)
MCV: 89.8 fL (ref 78.0–100.0)
PLATELETS: 345 10*3/uL (ref 150–400)
RBC: 3.44 MIL/uL — AB (ref 3.87–5.11)
RDW: 13.4 % (ref 11.5–15.5)
WBC: 8.7 10*3/uL (ref 4.0–10.5)

## 2015-08-12 NOTE — Care Management Note (Signed)
Case Management Note  Patient Details  Name: Dana Blanchard MRN: [id number] Date of Birth: [birth year]   Subjective/Objective:   60 yr old female s/p left total hip arthroplasty.                 Action/Plan: Case manager spoke with patient concerning [place] and DME needs. Choice was offered for Home Health agency. Patient states she used [place] with previous surgery and will do so now. CM called referral to Christa See, [facility]. Patient states she has rolling walker and 3in1 from previous surgery. Will have family support at discharge.   Expected Discharge Date:   08/13/15               Expected Discharge Plan:  [place]  In-House Referral:     Discharge planning Services  CM Consult  Post Acute Care Choice:  Home Health Choice offered to:  Patient  DME Arranged:  N/A DME Agency:  NA  HH Arranged:  PT [place] Agency:  [facility] ([facility])  Status of Service:  Completed, signed off  If discussed at [employer] of Stay Meetings, dates discussed:    Additional Comments:  Dana Meeker, RN 08/12/2015, 12:23 PM

## 2015-08-12 NOTE — Anesthesia Postprocedure Evaluation (Signed)
Anesthesia Post Note  Patient: Dana Blanchard  Procedure(s) Performed: Procedure(s) (LRB): LEFT TOTAL HIP ARTHROPLASTY ANTERIOR APPROACH (Left)  Patient location during evaluation: PACU Anesthesia Type: General Level of consciousness: sedated Pain management: satisfactory to patient Vital Signs Assessment: post-procedure vital signs reviewed and stable Respiratory status: spontaneous breathing Cardiovascular status: stable Anesthetic complications: no    Last Vitals:  Vitals:   08/12/15 0016 08/12/15 0616  BP: 117/60 (!) 99/55  Pulse: 91 96  Resp: 18 20  Temp: 36.8 C 36.8 C    Last Pain:  Vitals:   08/12/15 1236  TempSrc:   PainSc: [place]

## 2015-08-12 NOTE — Progress Notes (Signed)
   Assessment: 1 Day Post-Op  S/P Procedure(s) (LRB): LEFT TOTAL HIP ARTHROPLASTY ANTERIOR APPROACH (Left) by Dr. Ernesta Amble. Murphy on 08/11/15  Principal Problem:   Primary osteoarthritis of left hip Active Problems:   Anxiety   Post-operative nausea and vomiting ADDITIONAL DIAGNOSIS:  Acute Blood loss anemia - likely with dilutional component.    Plan: Advance diet, continue fluids till taking adequate PO. Up with therapy. Please provide incentive spirometer. Transition to PO Pain meds when able. Discharge home with home health pending PT evaluation today and if pain & nausea controlled PO, ambulating and tolerating diet.  Weight Bearing: Weight Bearing as Tolerated (WBAT) left leg Dressings: prn.  VTE prophylaxis: Aspirin, SCDs, ambulation Dispo: Home - Likely today or tomorrow.  Subjective: Patient reports pain as moderate. Pain controlled with IV and PO meds.  Tolerating diet.  Urinating.  +Flatus.  No CP, SOB.  OOB in room.    Objective:   VITALS:   Vitals:   08/11/15 1335 08/11/15 2106 08/12/15 0016 08/12/15 0616  BP: 115/65 123/63 117/60 (!) 99/55  Pulse: 94 89 91 96  Resp: 17 16 18 20   Temp: 98 F (36.7 C) 98.9 F (37.2 C) 98.3 F (36.8 C) 98.2 F (36.8 C)  TempSrc: Oral Oral Oral Oral  SpO2: 96% 94% 95% 95%  Weight:        Lab Results  Component Value Date   WBC 8.7 08/12/2015   HGB 10.0 (L) 08/12/2015   HCT 30.9 (L) 08/12/2015   MCV 89.8 08/12/2015   PLT 345 08/12/2015   BMET    Component Value Date/Time   NA 139 08/12/2015 0505   K 4.4 08/12/2015 0505   CL 106 08/12/2015 0505   CO2 26 08/12/2015 0505   GLUCOSE 104 (H) 08/12/2015 0505   BUN 13 08/12/2015 0505   CREATININE 0.82 08/12/2015 0505   CALCIUM 9.5 08/12/2015 0505   GFRNONAA >60 08/12/2015 0505   GFRAA >60 08/12/2015 0505    Physical Exam General: NAD.  Supine in bed.  Pleasant, conversant. Pulm:  No increased WOB. CV: RRR Neurologically intact ABD soft Neurovascular  intact Sensation intact distally Dorsiflexion/Plantar flexion intact Incision: dressing C/D/I   Prudencio Burly III 08/12/2015, 7:30 AM

## 2015-08-12 NOTE — Progress Notes (Signed)
Physical Therapy Treatment Patient Details Name: Dana Blanchard MRN: [id number] DOB: August 17, 1955 Today's Date: 08/12/2015    History of Present Illness Pt is a 60 y/o female s/p L THA (anterior approach). PMH including but not limited to R TKA (2016), R knee arthroscopy with meniscectomy (2015), Breast Cancer and L mastectomy.    PT Comments    Patient progressing well towards PT goals. Just reports soreness this afternoon. Performed stair training using RW backwards on stairs. Reviewed car transfers and exercise handout. Improved ambulation distance this afternoon. Pt eager to return home. Plans to discharge home this afternoon with assist from family. Will follow if still in hospital tomorrow.   Follow Up Recommendations  Home health PT;Supervision for mobility/OOB     Equipment Recommendations  None recommended by PT    Recommendations for Other Services       Precautions / Restrictions Precautions Precautions: Fall Restrictions Weight Bearing Restrictions: Yes LLE Weight Bearing: Weight bearing as tolerated    Mobility  Bed Mobility Overal bed mobility: Needs Assistance Bed Mobility: Sit to Supine     Supine to sit: Modified independent (Device/Increase time) Sit to supine: Modified independent (Device/Increase time)   General bed mobility comments: HOB flat, no use of rails to simulate home. no assist needed. Used UEs to bring LLE into bed to return to supine.  Transfers Overall transfer level: Needs assistance Equipment used: Rolling walker (2 wheeled) Transfers: Sit to/from Stand Sit to Stand: Modified independent (Device/Increase time)         General transfer comment: Mod I to stand from EOB x1, from toilet x1, from chair x1.   Ambulation/Gait Ambulation/Gait assistance: Modified independent (Device/Increase time) Ambulation Distance (Feet): 300 Feet Assistive device: Rolling walker (2 wheeled) Gait Pattern/deviations: Step-through pattern;Step-to  pattern;Antalgic;Decreased stance time - left;Decreased step length - right Gait velocity: decreased   General Gait Details: Cues for knee flexion during swing phase and for decreased WB through BUEs. Nos tanding rest breaks.   Stairs Stairs: Yes Stairs assistance: Min assist Stair Management: Step to pattern;Backwards;With walker Number of Stairs: 2 General stair comments: Cues for technique and safety with therapist stabilizing RW.   Wheelchair Mobility    Modified Rankin (Stroke Patients Only)       Balance Overall balance assessment: Needs assistance Sitting-balance support: Feet supported;No upper extremity supported Sitting balance-Leahy Scale: Good     Standing balance support: During functional activity Standing balance-Leahy Scale: Fair Standing balance comment: Able to stand at sink and brush teeth without UE support.                    Cognition Arousal/Alertness: Awake/alert Behavior During Therapy: WFL for tasks assessed/performed Overall Cognitive Status: Within Functional Limits for tasks assessed                      Exercises      General Comments        Pertinent Vitals/Pain Pain Assessment: 0-10 Pain Score: 4  Pain Location: left hip Pain Descriptors / Indicators: Sore Pain Intervention(s): Monitored during session;Repositioned;Ice applied    Home Living                      Prior Function            PT Goals (current goals can now be found in the care plan section) Progress towards PT goals: Progressing toward goals    Frequency  7X/week    PT Plan Current  plan remains appropriate    Co-evaluation             End of Session Equipment Utilized During Treatment: Gait belt Activity Tolerance: Patient tolerated treatment well Patient left: in bed;with call bell/phone within reach;with family/visitor present     Time: [id number] PT Time Calculation (min) (ACUTE ONLY): 29 min  Charges:  $Gait  Training: 23-37 mins                    G Codes:      Jadan Hinojos A Nanie Dunkleberger 08/12/2015, 3:13 PM Wray Kearns, [place], DPT [phone]

## 2015-08-12 NOTE — Evaluation (Signed)
Occupational Therapy Evaluation Patient Details Name: Dana Blanchard MRN: [id number] DOB: Jun 08, 1955 Today's Date: 08/12/2015    History of Present Illness Pt is a 60 y/o female s/p L THA (anterior approach). PMH including but not limited to R TKA (2016), R knee arthroscopy with meniscectomy (2015), Breast Cancer and L mastectomy.   Clinical Impression   Pt was admitted for the above sx. All education was completed. No further OT is needed at this time    Follow Up Recommendations  Supervision/Assistance - 24 hour    Equipment Recommendations  None recommended by OT    Recommendations for Other Services       Precautions / Restrictions Precautions Precautions: Fall Precaution Comments: educated on anterior precautions as op note not clear if direct anterior approach was used Restrictions Weight Bearing Restrictions: Yes LLE Weight Bearing: Weight bearing as tolerated      Mobility Bed Mobility Overal bed mobility: Needs Assistance Bed Mobility: Sit to Supine     Supine to sit: Min assist    General bed mobility comments: light support for legs  Transfers Overall transfer level: Needs assistance Equipment used: Rolling walker (2 wheeled) Transfers: Sit to/from Stand Sit to Stand: Modified independent (Device/Increase time)         General transfer comment: Mod I to stand from EOB x1, from toilet x1, from chair x1.     Balance Overall balance assessment: Needs assistance Sitting-balance support: Feet supported;No upper extremity supported Sitting balance-Leahy Scale: Good     Standing balance support: During functional activity Standing balance-Leahy Scale: Fair Standing balance comment: Able to stand at sink and brush teeth without UE support.                            ADL Overall ADL's : Needs assistance/impaired     Grooming: Oral care;Supervision/safety;Standing   Upper Body Bathing: Set up;Sitting   Lower Body Bathing: Minimal  assistance;Sit to/from stand   Upper Body Dressing : Set up;Sitting   Lower Body Dressing: Moderate assistance;Sit to/from stand   Toilet Transfer: Supervision/safety;Ambulation;RW (chair)   Toileting- Clothing Manipulation and Hygiene: Supervision/safety;Sit to/from stand         General ADL Comments: pt has a Secondary school teacher at home:  educated on ADL uses. Educated on anterior hip precautions for stepping into tub and for bed mobility.  She plans to sponge bathe initially. She is familiar with tub bench     Vision     Perception     Praxis      Pertinent Vitals/Pain Pain Assessment: 0-10 Pain Score: 4  Pain Location: L hip Pain Descriptors / Indicators: Sore Pain Intervention(s): Limited activity within patient's tolerance;Monitored during session;Premedicated before session;Repositioned;Ice applied     Hand Dominance     Extremity/Trunk Assessment Upper Extremity Assessment Upper Extremity Assessment: Overall WFL for tasks assessed           Communication Communication Communication: No difficulties   Cognition Arousal/Alertness: Awake/alert Behavior During Therapy: WFL for tasks assessed/performed Overall Cognitive Status: Within Functional Limits for tasks assessed                     General Comments       Exercises       Shoulder Instructions      Home Living Family/patient expects to be discharged to:: Private residence Living Arrangements: Spouse/significant other Available Help at Discharge: Family;Available 24 hours/day  Bathroom Shower/Tub: Risk analyst characteristics: Architectural technologist: Handicapped height     Home Equipment: Bedside commode          Prior Functioning/Environment Level of Independence: Independent;Needs assistance        Comments: sometimes needed assistance with adls--knee/hip pain    OT Diagnosis: Acute pain   OT Problem List:     OT Treatment/Interventions:       OT Goals(Current goals can be found in the care plan section) Acute Rehab OT Goals Patient Stated Goal: return home OT Goal Formulation: All assessment and education complete, DC therapy  OT Frequency:     Barriers to D/C:            Co-evaluation              End of Session    Activity Tolerance: Patient tolerated treatment well Patient left: in chair;with call bell/phone within reach   Time: 1353-1423 OT Time Calculation (min): 30 min Charges:  OT General Charges $OT Visit: 1 Procedure OT Evaluation $OT Eval Low Complexity: 1 Procedure OT Treatments $Self Care/Home Management : 8-22 mins G-Codes:    Jillayne Witte 09-07-2015, 3:32 PM Lesle Chris, OTR/L [phone] 07-Sep-2015

## 2015-08-12 NOTE — Progress Notes (Signed)
Physical Therapy Treatment Patient Details Name: Dana Blanchard MRN: [id number] DOB: 1955/10/30 Today's Date: 08/12/2015    History of Present Illness Pt is a 60 y/o female s/p L THA (anterior approach). PMH including but not limited to R TKA (2016), R knee arthroscopy with meniscectomy (2015), Breast Cancer and L mastectomy.    PT Comments    Patient progressing well towards PT goals. Tolerated gait training with supervision for safety. Able to perform bed mobility and transfers with supervision-Mod I. Instructed pt in exercises to perform with handout. Plan for stair training this PM as tolerated with family present and then pt should be safe to discharge after PM session. Will follow.   Follow Up Recommendations  Home health PT;Supervision for mobility/OOB     Equipment Recommendations  None recommended by PT    Recommendations for Other Services       Precautions / Restrictions Precautions Precautions: Fall Restrictions Weight Bearing Restrictions: Yes LLE Weight Bearing: Weight bearing as tolerated    Mobility  Bed Mobility Overal bed mobility: Needs Assistance Bed Mobility: Supine to Sit     Supine to sit: Modified independent (Device/Increase time)     General bed mobility comments: HOB flat, no use of rails to simulate home. no assist needed.  Transfers Overall transfer level: Needs assistance Equipment used: Rolling walker (2 wheeled) Transfers: Sit to/from Stand Sit to Stand: Supervision         General transfer comment: Supervision for safety. Stood from [employer]. from [facility] x1, transferred to chair post ambulation bout.  Ambulation/Gait Ambulation/Gait assistance: Supervision Ambulation Distance (Feet): 175 Feet Assistive device: Rolling walker (2 wheeled) Gait Pattern/deviations: Step-through pattern;Decreased stance time - left;Decreased step length - right;Trunk flexed Gait velocity: decreased Gait velocity interpretation: Below normal speed for  age/gender General Gait Details: Cues for knee extension during stance phase and for knee flexion during swing phase. 2 standing rest breaks due to fatigue in UEs.    Stairs            Wheelchair Mobility    Modified Rankin (Stroke Patients Only)       Balance Overall balance assessment: Needs assistance Sitting-balance support: Feet supported;No upper extremity supported Sitting balance-Leahy Scale: Good     Standing balance support: During functional activity Standing balance-Leahy Scale: Fair                      Cognition Arousal/Alertness: Awake/alert Behavior During Therapy: WFL for tasks assessed/performed Overall Cognitive Status: Within Functional Limits for tasks assessed                      Exercises Total Joint Exercises Ankle Circles/Pumps: Both;10 reps;Supine Quad Sets: Both;5 reps;Supine Long Arc Quad: Left;10 reps;Strengthening;Seated    General Comments        Pertinent Vitals/Pain Pain Assessment: 0-10 Pain Score: 3  Pain Location: left hip Pain Descriptors / Indicators: Sore Pain Intervention(s): Monitored during session;Ice applied;Repositioned    Home Living                      Prior Function            PT Goals (current goals can now be found in the care plan section) Progress towards PT goals: Progressing toward goals    Frequency  7X/week    PT Plan Current plan remains appropriate    Co-evaluation             End of  Session Equipment Utilized During Treatment: Gait belt Activity Tolerance: Patient tolerated treatment well Patient left: in chair;with call bell/phone within reach     Time: 0907-0940 PT Time Calculation (min) (ACUTE ONLY): 33 min  Charges:  $Gait Training: 23-37 mins                    G Codes:      Dana Blanchard 08/12/2015, 10:26 AM Wray Kearns, PT, DPT [phone]

## 2015-08-13 NOTE — Discharge Summary (Signed)
Discharge Summary  Patient ID: Dana Blanchard MRN: [id number] DOB/AGE: [birth year] 60 y.o.  Admit date: 08/11/2015 Discharge date: 08/13/2015  Admission Diagnoses:  Primary osteoarthritis of left hip  Discharge Diagnoses:  Principal Problem:   Primary osteoarthritis of left hip Active Problems:   Anxiety   Post-operative nausea and vomiting   Past Medical History:  Diagnosis Date  . Anemia    /w chemo- anemic, blood transfusion post op- mastectomy   . Anxiety    +panic attack- yes at dentist office & before surgery   . Breast cancer ([place])   . Complication of anesthesia   . Depression   . H/O renal calculi   . Headache   . History of chemotherapy   . PONV (postoperative nausea and vomiting)   . Post-operative nausea and vomiting 01/07/2015   Patient did not discharge on the 20th because of episodes of vomiting.  No vomiting on 21    Plan to D/C home  . Primary localized osteoarthritis of right knee     Surgeries: Procedure(s): LEFT TOTAL HIP ARTHROPLASTY ANTERIOR APPROACH on 08/11/2015   Consultants (if any):   Discharged Condition: Improved  Hospital Course: Dana Blanchard is an 60 y.o. female who was admitted 08/11/2015 with a diagnosis of Primary osteoarthritis of left hip and went to the operating room on 08/11/2015 and underwent the above named procedures.    She was given perioperative antibiotics:  Anti-infectives    Start     Dose/Rate Route Frequency Ordered Stop   08/11/15 1400  ceFAZolin (ANCEF) IVPB 2g/100 mL premix     2 g 200 mL/hr over 30 Minutes Intravenous Every 6 hours 08/11/15 1320 08/11/15 2217   08/11/15 0600  ceFAZolin (ANCEF) IVPB 2g/100 mL premix     2 g 200 mL/hr over 30 Minutes Intravenous To [facility] 08/10/15 1038 08/11/15 0738    .  She was given sequential compression devices, early ambulation, and ASA 325 mg for DVT prophylaxis.  She benefited maximally from the hospital stay and there were no complications.    Recent vital  signs:  Vitals:   08/12/15 0616 08/12/15 1300  BP: (!) 99/55 139/77  Pulse: 96 96  Resp: 20 18  Temp: 98.2 F (36.8 C) 98.2 F (36.8 C)    Recent laboratory studies:  Lab Results  Component Value Date   HGB 10.0 (L) 08/12/2015   HGB 12.1 07/31/2015   HGB 9.6 (L) 01/07/2015   Lab Results  Component Value Date   WBC 8.7 08/12/2015   PLT 345 08/12/2015   Lab Results  Component Value Date   INR 1.10 07/31/2015   Lab Results  Component Value Date   NA 139 08/12/2015   K 4.4 08/12/2015   CL 106 08/12/2015   CO2 26 08/12/2015   BUN 13 08/12/2015   CREATININE 0.82 08/12/2015   GLUCOSE 104 (H) 08/12/2015    Discharge Medications:     Medication List    TAKE these medications   aspirin EC 325 MG tablet Take 1 tablet (325 mg total) by mouth daily.   buPROPion 150 MG 12 hr tablet Commonly known as:  WELLBUTRIN SR Take 150 mg by mouth daily before breakfast.   buPROPion 150 MG 24 hr tablet Commonly known as:  WELLBUTRIN XL Take 150 mg by mouth daily.   cetirizine-pseudoephedrine 5-120 MG tablet Commonly known as:  ZYRTEC-D Take 1 tablet by mouth 2 (two) times daily as needed for allergies.   cholecalciferol 1000 units tablet Commonly known  as:  VITAMIN D Take 1,000 Units by mouth daily.   citalopram 10 MG tablet Commonly known as:  CELEXA Take 10 mg by mouth daily before breakfast.   docusate sodium 100 MG capsule Commonly known as:  COLACE Take 1 capsule (100 mg total) by mouth 2 (two) times daily. What changed:  how much to take  how to take this  when to take this  additional instructions   enoxaparin 30 MG/0.3ML injection Commonly known as:  LOVENOX Inject 0.3 mLs (30 mg total) into the skin every 12 (twelve) hours.   fenofibrate 160 MG tablet Take 160 mg by mouth daily.   MAGNESIUM PO Take 1 tablet by mouth daily.   Melatonin 3 MG Tabs Take 3 mg by mouth at bedtime.   meloxicam 15 MG tablet Commonly known as:  MOBIC Take 15 mg by  mouth daily.   methocarbamol 500 MG tablet Commonly known as:  ROBAXIN Take 1 tablet (500 mg total) by mouth every 6 (six) hours as needed for muscle spasms.   omeprazole 20 MG capsule Commonly known as:  PRILOSEC Take 1 capsule (20 mg total) by mouth daily.   ondansetron 4 MG tablet Commonly known as:  ZOFRAN Take 1 tablet (4 mg total) by mouth every 8 (eight) hours as needed for nausea or vomiting.   oxyCODONE-acetaminophen 5-325 MG tablet Commonly known as:  ROXICET Take 1-2 tablets by mouth every 4 (four) hours as needed for severe pain.   PROBIOTIC PO Take 1 capsule by mouth daily.   rizatriptan 10 MG tablet Commonly known as:  MAXALT Take 10 mg by mouth as needed. May repeat every 2 hours as needed (max 3 tabs in 24 hours)   saccharomyces boulardii 250 MG capsule Commonly known as:  FLORASTOR Take 250 mg by mouth daily.   vitamin B-12 1000 MCG tablet Commonly known as:  CYANOCOBALAMIN Take 1,000 mcg by mouth daily.       Diagnostic Studies: Dg C-arm 1-60 Min  Result Date: 08/11/2015 CLINICAL DATA:  Left hip replacement . EXAM: OPERATIVE left HIP (WITH PELVIS IF PERFORMED)  VIEWS TECHNIQUE: Fluoroscopic spot image(s) were submitted for interpretation post-operatively. COMPARISON:  No recent prior. FINDINGS: Total left hip replacement with good anatomic alignment. Hardware intact.Two images obtained. 0 minutes 13 seconds fluoroscopy time. IMPRESSION: Total left hip replacement with good anatomic alignment. Electronically Signed   By: Marcello Moores  Register   On: 08/11/2015 09:28  Dg Hip Operative Unilat W Or W/o Pelvis Left  Result Date: 08/11/2015 CLINICAL DATA:  Left hip replacement . EXAM: OPERATIVE left HIP (WITH PELVIS IF PERFORMED)  VIEWS TECHNIQUE: Fluoroscopic spot image(s) were submitted for interpretation post-operatively. COMPARISON:  No recent prior. FINDINGS: Total left hip replacement with good anatomic alignment. Hardware intact.Two images obtained. 0 minutes 13  seconds fluoroscopy time. IMPRESSION: Total left hip replacement with good anatomic alignment. Electronically Signed   By: Marcello Moores  Register   On: 08/11/2015 09:28   Disposition: 01-Home or Self Care    Follow-up Information    MURPHY, TIMOTHY D, MD Follow up in 2 week(s).   Specialty:  Orthopedic Surgery Contact information: [address] [place] [address] [phone]        [facility] .   Why:  Someone from [facility] will contact you to arrange start date and time for therapy. Contact information: [address] [place] [address] [phone]  Signed: Prudencio Burly III PA-C 08/13/2015, 7:16 AM

## 2018-04-19 IMAGING — RF DG C-ARM 61-120 MIN
1 series · 2 of 2 positions shown · non-contrast
Comparison: No recent prior.

CLINICAL DATA: Left hip replacement .

EXAM:
OPERATIVE left HIP (WITH PELVIS IF PERFORMED)  VIEWS
TECHNIQUE: Fluoroscopic spot image(s) were submitted for interpretation
post-operatively.

[Series 1: run · 2 of 2 slices shown]
[im 1/2]
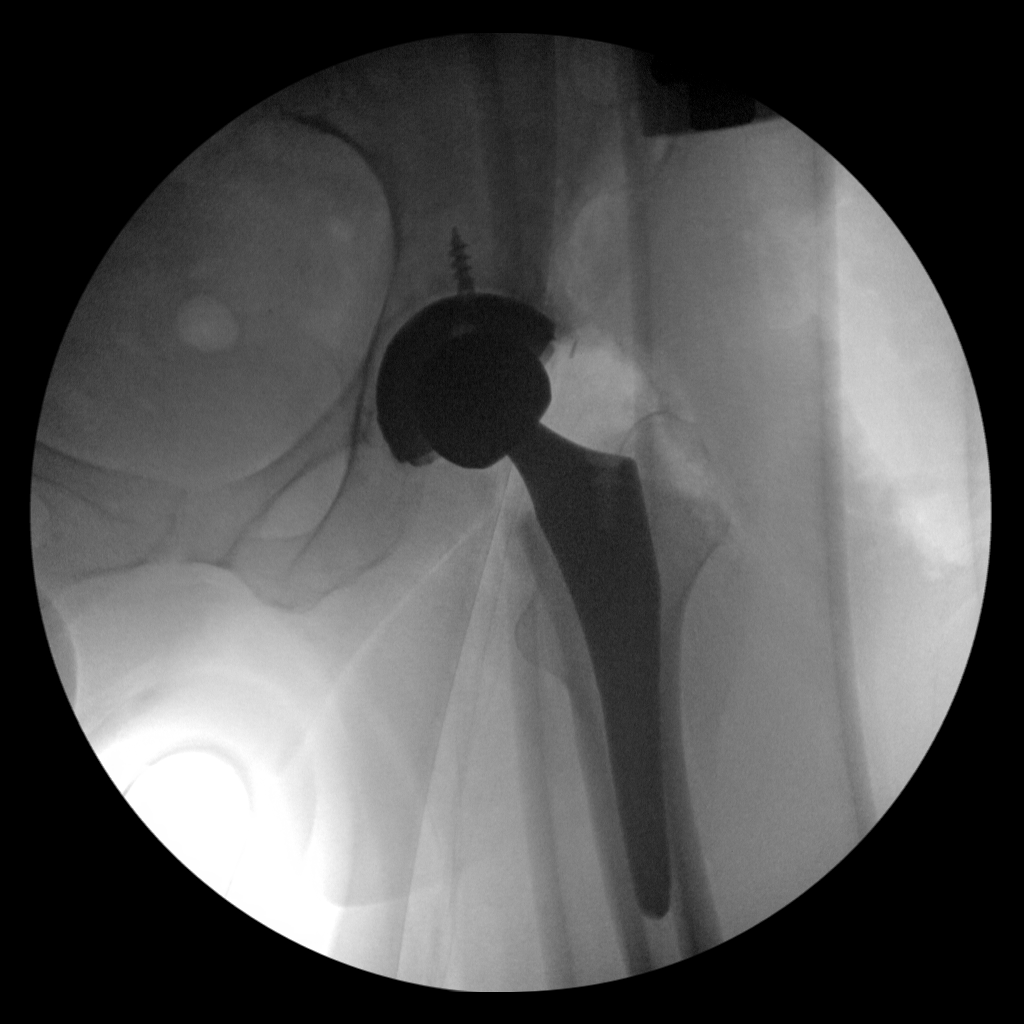
[im 2/2]
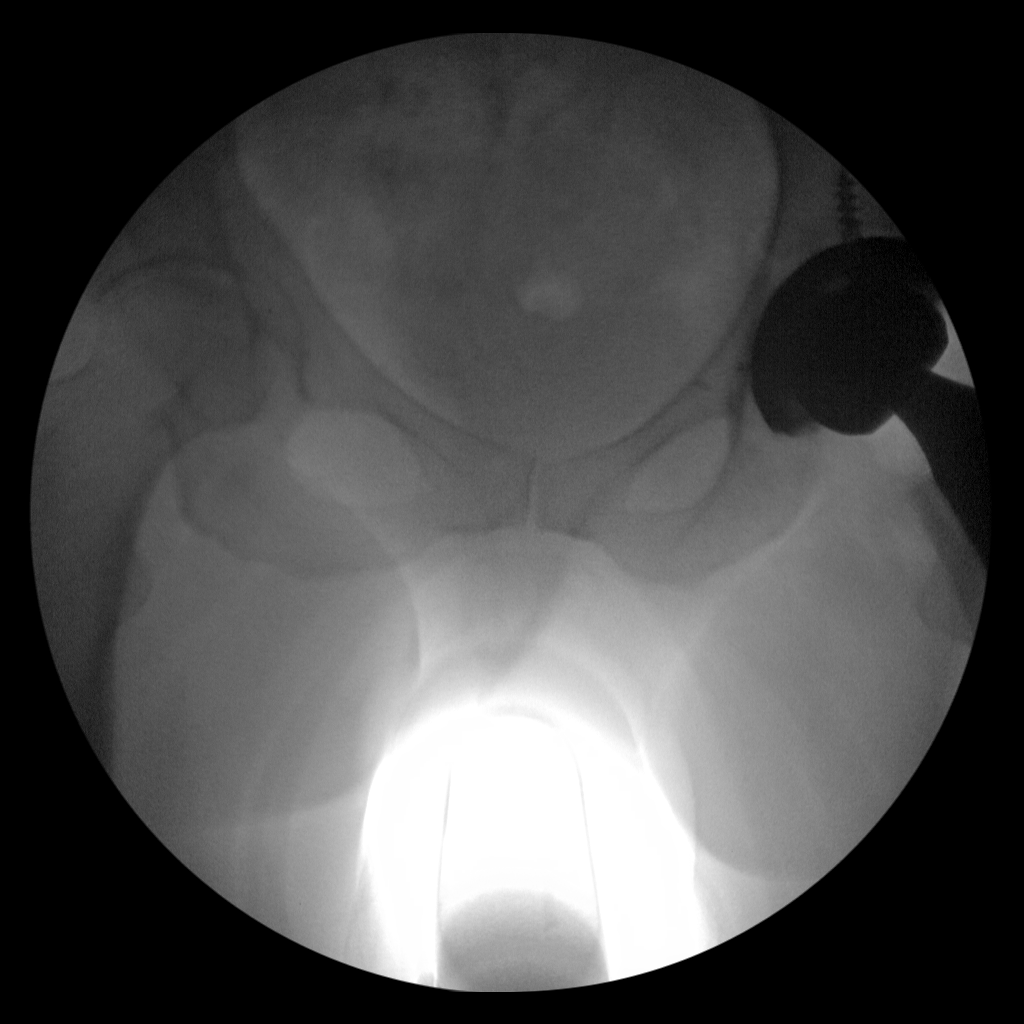

[2 of 2 positions shown; findings below may reference images not displayed]

FINDINGS: Total left hip replacement with good anatomic alignment. Hardware
intact.Two images obtained. 0 minutes 13 seconds fluoroscopy time.
IMPRESSION: Total left hip replacement with good anatomic alignment.

## 2021-10-25 ENCOUNTER — Other Ambulatory Visit (HOSPITAL_COMMUNITY): Payer: Self-pay

## 2021-10-25 MED ORDER — SAXENDA 18 MG/3ML ~~LOC~~ SOPN
PEN_INJECTOR | SUBCUTANEOUS | 0 refills | Status: AC
  Filled 2021-10-25: qty 15, 30d supply, fill #0

## 2021-10-25 MED ORDER — PEN NEEDLES 32G X 4 MM MISC
0 refills | Status: AC
  Filled 2021-10-25: qty 100, 90d supply, fill #0

## 2021-10-26 ENCOUNTER — Other Ambulatory Visit (HOSPITAL_BASED_OUTPATIENT_CLINIC_OR_DEPARTMENT_OTHER): Payer: Self-pay

## 2021-10-26 ENCOUNTER — Other Ambulatory Visit (HOSPITAL_COMMUNITY): Payer: Self-pay
